# Patient Record
Sex: Female | Born: 1985 | Race: Black or African American | Hispanic: No | Marital: Single | Smoking: Former smoker
Health system: Southern US, Community
[De-identification: ages and names within clinical notes are randomized; demographics above are authoritative.]

## PROBLEM LIST (undated history)

## (undated) ENCOUNTER — Inpatient Hospital Stay (HOSPITAL_COMMUNITY): Payer: Self-pay

## (undated) DIAGNOSIS — I10 Essential (primary) hypertension: Secondary | ICD-10-CM

---

## 2010-04-02 ENCOUNTER — Ambulatory Visit: Payer: Self-pay | Admitting: Obstetrics & Gynecology

## 2010-04-02 ENCOUNTER — Inpatient Hospital Stay (HOSPITAL_COMMUNITY)
Admission: AD | Admit: 2010-04-02 | Discharge: 2010-04-09 | Payer: Self-pay | Source: Home / Self Care | Admitting: Obstetrics & Gynecology

## 2010-04-06 ENCOUNTER — Encounter: Payer: Self-pay | Admitting: Obstetrics & Gynecology

## 2010-04-09 ENCOUNTER — Encounter: Admission: RE | Admit: 2010-04-09 | Discharge: 2010-04-12 | Payer: Self-pay | Admitting: Obstetrics & Gynecology

## 2010-12-30 LAB — CBC
HCT: 35.5 % — ABNORMAL LOW (ref 36.0–46.0)
HCT: 39.5 % (ref 36.0–46.0)
Hemoglobin: 12 g/dL (ref 12.0–15.0)
Hemoglobin: 13.4 g/dL (ref 12.0–15.0)
MCH: 30.3 pg (ref 26.0–34.0)
MCHC: 34 g/dL (ref 30.0–36.0)
MCV: 89.9 fL (ref 78.0–100.0)
Platelets: 150 10*3/uL (ref 150–400)
RBC: 3.97 MIL/uL (ref 3.87–5.11)
RBC: 4.4 MIL/uL (ref 3.87–5.11)
RDW: 13 % (ref 11.5–15.5)
WBC: 10.1 10*3/uL (ref 4.0–10.5)
WBC: 15 10*3/uL — ABNORMAL HIGH (ref 4.0–10.5)

## 2010-12-30 LAB — RPR: RPR Ser Ql: NONREACTIVE

## 2010-12-30 LAB — COMPREHENSIVE METABOLIC PANEL
AST: 16 U/L (ref 0–37)
BUN: 5 mg/dL — ABNORMAL LOW (ref 6–23)
CO2: 25 mEq/L (ref 19–32)
Chloride: 105 mEq/L (ref 96–112)
Creatinine, Ser: 0.45 mg/dL (ref 0.4–1.2)
Glucose, Bld: 97 mg/dL (ref 70–99)
Sodium: 135 mEq/L (ref 135–145)
Total Bilirubin: 0.3 mg/dL (ref 0.3–1.2)
Total Protein: 5.8 g/dL — ABNORMAL LOW (ref 6.0–8.3)

## 2010-12-30 LAB — WET PREP, GENITAL: Yeast Wet Prep HPF POC: NONE SEEN

## 2010-12-30 LAB — GC/CHLAMYDIA PROBE AMP, GENITAL: Chlamydia, DNA Probe: NEGATIVE

## 2013-12-14 ENCOUNTER — Encounter (HOSPITAL_COMMUNITY): Payer: Self-pay | Admitting: Emergency Medicine

## 2013-12-14 ENCOUNTER — Emergency Department (HOSPITAL_COMMUNITY)
Admission: EM | Admit: 2013-12-14 | Discharge: 2013-12-14 | Disposition: A | Discharge status: Home / Self Care | Payer: 59 | Attending: Emergency Medicine | Admitting: Emergency Medicine

## 2013-12-14 DIAGNOSIS — Y939 Activity, unspecified: Secondary | ICD-10-CM | POA: Insufficient documentation

## 2013-12-14 DIAGNOSIS — Y929 Unspecified place or not applicable: Secondary | ICD-10-CM | POA: Insufficient documentation

## 2013-12-14 DIAGNOSIS — W010XXA Fall on same level from slipping, tripping and stumbling without subsequent striking against object, initial encounter: Secondary | ICD-10-CM | POA: Insufficient documentation

## 2013-12-14 DIAGNOSIS — S01501A Unspecified open wound of lip, initial encounter: Secondary | ICD-10-CM | POA: Insufficient documentation

## 2013-12-14 DIAGNOSIS — F17200 Nicotine dependence, unspecified, uncomplicated: Secondary | ICD-10-CM | POA: Insufficient documentation

## 2013-12-14 DIAGNOSIS — S01511A Laceration without foreign body of lip, initial encounter: Secondary | ICD-10-CM

## 2013-12-14 NOTE — ED Provider Notes (Signed)
CSN: [id number]     Arrival date & time 12/14/13  1705 History  This chart was scribed for non-physician practitioner Lowella Dell, PA-C working with Layla Maw Ward, DO by Danella Maiers, ED Scribe. This patient was seen in room TR09C/TR09C and the patient's care was started at 5:32 PM.   Chief Complaint  Patient presents with  . Lip Laceration   The history is provided by the patient. No language interpreter was used.   HPI Comments: Diamond Patel is a 28 y.o. female who presents to the Emergency Department complaining of a laceration to her right lateral lower lip onset tonight after tripping and falling on some steps. She was on the second to last stair when she fell. She denies dizziness, CP, or SOB prior to fall. She states her mouth struck the wooden railing on the bridge. She is not on any blood thinners. She denies LOC, vision changes, any pain.   Nursing noted reveals patient complaining of Right foot pain, though patient denies any foot pain currently.    History reviewed. No pertinent past medical history. History reviewed. No pertinent past surgical history. No family history on file. History  Substance Use Topics  . Smoking status: Current Every Day Smoker -- 0.10 packs/day    Types: Cigarettes  . Smokeless tobacco: Not on file  . Alcohol Use: Yes     Comment: occasionally   OB History   Grav Para Term Preterm Abortions TAB SAB Ect Mult Living                 Review of Systems  All other systems reviewed and are negative.      Allergies  Review of patient's allergies indicates no known allergies.  Home Medications  No current outpatient prescriptions on file. BP 155/95  Pulse 102  Temp(Src) 98.8 F (37.1 C) (Oral)  Resp 18  Wt 155 lb (70.308 kg)  SpO2 99%  LMP 12/11/2013 Physical Exam  Nursing note and vitals reviewed. Constitutional: She is oriented to person, place, and time. She appears well-developed and well-nourished. No distress.  HENT:  Head:  Normocephalic and atraumatic.  Mouth/Throat:     Small lip lac to right lower vermillion border. Approximately 1 cm in length and 1cm deep.   Eyes: Conjunctivae are normal.  Neck: No JVD present. No tracheal deviation present.  Cardiovascular: Normal rate and regular rhythm.  Exam reveals no gallop and no friction rub.   No murmur heard. Pulmonary/Chest: Effort normal. No respiratory distress. She has no wheezes. She has no rhonchi. She has no rales.  Musculoskeletal: Normal range of motion. She exhibits no edema.  right foot non-tender no bruising or ecchymosis. No swelling. No injury.   Neurological: She is alert and oriented to person, place, and time.  Skin: Skin is warm and dry. She is not diaphoretic.  Psychiatric: She has a normal mood and affect. Her behavior is normal.    ED Course  Procedures (including critical care time) Medications - No data to display  DIAGNOSTIC STUDIES: Oxygen Saturation is 99% on RA, normal by my interpretation.    COORDINATION OF CARE: 5:59 PM- Discussed treatment plan with pt which includes lac repair. Pt agrees to plan.  LACERATION REPAIR Performed by: Rudene Anda, PA-C  Consent: Verbal consent obtained. Risks and benefits: risks, benefits and alternatives were discussed Patient identity confirmed: provided demographic data Time out performed prior to procedure Prepped and Draped in normal sterile fashion Wound explored Laceration Location: right lateral lower  lip Laceration Length: 1cm No Foreign Bodies seen or palpated Anesthesia: local infiltration Local anesthetic: lidocaine 2% without epinephrine Anesthetic total: 2 ml Irrigation method: syringe Amount of cleaning: standard Skin closure: 5-0 Vicryl Number of sutures or staples: 4 Technique: simple interrupted Patient tolerance: Patient tolerated the procedure well with no immediate complications.     Labs Review Labs Reviewed - No data to display Imaging Review No  results found.   EKG Interpretation None      MDM   Final diagnoses:  Lip laceration   Patient afebrile. BP elevated. Patient asymptomatic, in NAD.  Patient advised to return to ED in 5 days for suture removal. Patient agrees with plan. Discussed wound care with patient. Patient confirms understanding. Discharged in good condition.    I personally performed the services described in this documentation, which was scribed in my presence. The recorded information has been reviewed and is accurate.   Rudene AndaJacob Gray Marilynn Ekstein, PA-C 12/17/13 1340

## 2013-12-14 NOTE — Discharge Instructions (Signed)
Return To ED in 5 days for suture removal. Return earlier if you develop any worsening pain, redness, or drainage from the area.    Facial Laceration  A facial laceration is a cut on the face. These injuries can be painful and cause bleeding. Lacerations usually heal quickly, but they need special care to reduce scarring. DIAGNOSIS  Your health care provider will take a medical history, ask for details about how the injury occurred, and examine the wound to determine how deep the cut is. TREATMENT  Some facial lacerations may not require closure. Others may not be able to be closed because of an increased risk of infection. The risk of infection and the chance for successful closure will depend on various factors, including the amount of time since the injury occurred. The wound may be cleaned to help prevent infection. If closure is appropriate, pain medicines may be given if needed. Your health care provider will use stitches (sutures), wound glue (adhesive), or skin adhesive strips to repair the laceration. These tools bring the skin edges together to allow for faster healing and a better cosmetic outcome. If needed, you may also be given a tetanus shot. HOME CARE INSTRUCTIONS  Only take over-the-counter or prescription medicines as directed by your health care provider.  Follow your health care provider's instructions for wound care. These instructions will vary depending on the technique used for closing the wound. For Sutures:  Keep the wound clean and dry.   If you were given a bandage (dressing), you should change it at least once a day. Also change the dressing if it becomes wet or dirty, or as directed by your health care provider.   Wash the wound with soap and water 2 times a day. Rinse the wound off with water to remove all soap. Pat the wound dry with a clean towel.   After cleaning, apply a thin layer of the antibiotic ointment recommended by your health care provider. This  will help prevent infection and keep the dressing from sticking.   You may shower as usual after the first 24 hours. Do not soak the wound in water until the sutures are removed.   Get your sutures removed as directed by your health care provider. With facial lacerations, sutures should usually be taken out after 4 5 days to avoid stitch marks.   Wait a few days after your sutures are removed before applying any makeup. For Skin Adhesive Strips:  Keep the wound clean and dry.   Do not get the skin adhesive strips wet. You may bathe carefully, using caution to keep the wound dry.   If the wound gets wet, pat it dry with a clean towel.   Skin adhesive strips will fall off on their own. You may trim the strips as the wound heals. Do not remove skin adhesive strips that are still stuck to the wound. They will fall off in time.  For Wound Adhesive:  You may briefly wet your wound in the shower or bath. Do not soak or scrub the wound. Do not swim. Avoid periods of heavy sweating until the skin adhesive has fallen off on its own. After showering or bathing, gently pat the wound dry with a clean towel.   Do not apply liquid medicine, cream medicine, ointment medicine, or makeup to your wound while the skin adhesive is in place. This may loosen the film before your wound is healed.   If a dressing is placed over the wound, be  careful not to apply tape directly over the skin adhesive. This may cause the adhesive to be pulled off before the wound is healed.   Avoid prolonged exposure to sunlight or tanning lamps while the skin adhesive is in place.  The skin adhesive will usually remain in place for 5 10 days, then naturally fall off the skin. Do not pick at the adhesive film.  After Healing: Once the wound has healed, cover the wound with sunscreen during the day for 1 full year. This can help minimize scarring. Exposure to ultraviolet light in the first year will darken the scar. It can  take 1 2 years for the scar to lose its redness and to heal completely.  SEEK IMMEDIATE MEDICAL CARE IF:  You have redness, pain, or swelling around the wound.   You see ayellowish-white fluid (pus) coming from the wound.   You have chills or a fever.  MAKE SURE YOU:  Understand these instructions.  Will watch your condition.  Will get help right away if you are not doing well or get worse. Document Released: 11/07/2004 Document Revised: 07/21/2013 Document Reviewed: 05/13/2013 [facility] Patient Information 2014 [place], [place].

## 2013-12-14 NOTE — ED Notes (Signed)
Pts BP elevated, asked pt to stop talking on the phone for a few minutes to recheck her BP and discuss discharge papers. Pt states that her BP is fine and she is fine to go, that she is not under 18. Explained to pt that her BP is elevated and I would prefer to recheck it and if its elevated notify the physician. Pt states that she is fine and wishes to sign out. Pt states her BP is high because she is talking on the phone. Pt signs out and is still on the phone.

## 2013-12-14 NOTE — ED Notes (Signed)
Pt sts she slipped on some steps and fell. C/o small lip laceration to right lip. Pt also c/o right foot pain. Denies LOC. Nad, skin warm and dry, resp e/u.

## 2013-12-14 NOTE — ED Notes (Signed)
Patient states that she fell and struck her jaw.  A small LAC is noted on her right lower lateral lip.  Some bruising is noted on her inner lip.  Denies LOC, headache.

## 2013-12-17 NOTE — ED Provider Notes (Signed)
Medical screening examination/treatment/procedure(s) were performed by non-physician practitioner and as supervising physician I was immediately available for consultation/collaboration.   EKG Interpretation None        Darrien Belter N Tiffanyann Deroo, DO 12/17/13 1503 

## 2014-12-09 ENCOUNTER — Emergency Department (HOSPITAL_COMMUNITY)
Admission: EM | Admit: 2014-12-09 | Discharge: 2014-12-09 | Disposition: A | Discharge status: Home / Self Care | Payer: 59 | Attending: Emergency Medicine | Admitting: Emergency Medicine

## 2014-12-09 ENCOUNTER — Encounter (HOSPITAL_COMMUNITY): Payer: Self-pay | Admitting: *Deleted

## 2014-12-09 DIAGNOSIS — R112 Nausea with vomiting, unspecified: Secondary | ICD-10-CM | POA: Insufficient documentation

## 2014-12-09 DIAGNOSIS — R197 Diarrhea, unspecified: Secondary | ICD-10-CM | POA: Insufficient documentation

## 2014-12-09 DIAGNOSIS — IMO0001 Reserved for inherently not codable concepts without codable children: Secondary | ICD-10-CM

## 2014-12-09 DIAGNOSIS — K029 Dental caries, unspecified: Secondary | ICD-10-CM | POA: Insufficient documentation

## 2014-12-09 DIAGNOSIS — R030 Elevated blood-pressure reading, without diagnosis of hypertension: Secondary | ICD-10-CM | POA: Insufficient documentation

## 2014-12-09 DIAGNOSIS — Z720 Tobacco use: Secondary | ICD-10-CM | POA: Insufficient documentation

## 2014-12-09 DIAGNOSIS — R1013 Epigastric pain: Secondary | ICD-10-CM

## 2014-12-09 DIAGNOSIS — R079 Chest pain, unspecified: Secondary | ICD-10-CM | POA: Insufficient documentation

## 2014-12-09 MED ORDER — HYDROCODONE-ACETAMINOPHEN 5-325 MG PO TABS
1.0000 | ORAL_TABLET | Freq: Once | ORAL | Status: AC
  Administered 2014-12-09: 1 via ORAL
  Filled 2014-12-09: qty 1

## 2014-12-09 MED ORDER — ONDANSETRON HCL 4 MG PO TABS: 4.0000 mg | ORAL_TABLET | Freq: Four times a day (QID) | ORAL | Status: DC

## 2014-12-09 MED ORDER — OMEPRAZOLE 20 MG PO CPDR: 20.0000 mg | DELAYED_RELEASE_CAPSULE | Freq: Every day | ORAL | Status: DC

## 2014-12-09 MED ORDER — HYDROCODONE-ACETAMINOPHEN 5-325 MG PO TABS: 1.0000 | ORAL_TABLET | Freq: Four times a day (QID) | ORAL | Status: DC | PRN

## 2014-12-09 MED ORDER — PENICILLIN V POTASSIUM 500 MG PO TABS: 500.0000 mg | ORAL_TABLET | Freq: Three times a day (TID) | ORAL | Status: DC

## 2014-12-09 NOTE — ED Provider Notes (Signed)
CSN: [id number]     Arrival date & time 12/09/14  0919 History  This chart was scribed for Diamond Piedra, PA-C working with [name], MD by Elveria Rising, ED Scribe. This patient was seen in room TR09C/TR09C and the patient's care was started at 9:40 AM.   Chief Complaint  Patient presents with  . Dental Pain   The history is provided by the patient. No language interpreter was used.   HPI Comments: Diamond Patel is a 29 y.o. female who presents to the Emergency Department complaining of throbbing right upper dental pain ongoing for two weeks. Patient denies injury to the tooth, cavities, or recent dental procedures performed. Patient denies difficulty opening mouth. Patient reports treatment with Aleve, ibuprofen, and Advil. Patient reports taking four tablets a day.   Patient reports upper abdominal/epigastric pain for three weeks now. Patient likens pain to a muscle spasm or cramp that feels constantly. Patient reports that she has taken three pregnancy tests, all resulting negatively. Patient reports onset of her abdominal pain prior to her dental pain and taking Aleve regularly for treatment.  Patient reports urinary frequency, vaginal discharge and chest pain associated with onset of her abdominal pain. Patient denies reflux sensation, heart burn, hematochezia, or melena. Patient reports an upcoming appointment with her PCP 3/04, one week from now.   History reviewed. No pertinent past medical history. History reviewed. No pertinent past surgical history. History reviewed. No pertinent family history. History  Substance Use Topics  . Smoking status: Current Every Day Smoker -- 0.10 packs/day    Types: Cigarettes  . Smokeless tobacco: Not on file  . Alcohol Use: Yes     Comment: occasionally   OB History    No data available     Review of Systems  Constitutional: Negative for fever and chills.  HENT: Positive for dental problem. Negative for sore throat and trouble  swallowing.   Cardiovascular: Positive for chest pain.  Gastrointestinal: Positive for nausea, vomiting, abdominal pain and diarrhea. Negative for constipation.  Genitourinary: Positive for frequency. Negative for dysuria, urgency and hematuria.  All other systems reviewed and are negative.  Allergies  Review of patient's allergies indicates no known allergies.  Home Medications   Prior to Admission medications   Medication Sig Start Date End Date Taking? Authorizing Provider  HYDROcodone-acetaminophen (NORCO/VICODIN) 5-325 MG per tablet Take 1 tablet by mouth every 6 (six) hours as needed. 12/09/14   Hannibal Skalla A Forcucci, PA-C  omeprazole (PRILOSEC) 20 MG capsule Take 1 capsule (20 mg total) by mouth daily. 12/09/14   Yetunde Leis A Forcucci, PA-C  ondansetron (ZOFRAN) 4 MG tablet Take 1 tablet (4 mg total) by mouth every 6 (six) hours. 12/09/14   Sharlene Mccluskey A Forcucci, PA-C  penicillin v potassium (VEETID) 500 MG tablet Take 1 tablet (500 mg total) by mouth 3 (three) times daily. 12/09/14   Braelon Sprung A Forcucci, PA-C   Triage Vitals: BP 179/112 mmHg  Pulse 94  Temp(Src) 97.5 F (36.4 C) (Oral)  Resp 16  Ht  (1.626 m)  Wt 162 lb (73.483 kg)  BMI 27.79 kg/m2  SpO2 100%  LMP 10/15/2014 Physical Exam  Constitutional: She is oriented to person, place, and time. She appears well-developed and well-nourished. No distress.  HENT:  Head: Normocephalic and atraumatic.  Mouth/Throat: Uvula is midline, oropharynx is clear and moist and mucous membranes are normal. No trismus in the jaw. Dental caries present. No dental abscesses or uvula swelling.    Eyes: Conjunctivae and EOM  are normal. Pupils are equal, round, and reactive to light. No scleral icterus.  Neck: Normal range of motion. Neck supple. No JVD present. No thyromegaly present.  Cardiovascular: Normal rate, regular rhythm, normal heart sounds and intact distal pulses.  Exam reveals no gallop and no friction rub.   No murmur  heard. Pulmonary/Chest: Effort normal and breath sounds normal. No respiratory distress. She has no wheezes. She has no rales. She exhibits no tenderness.  Abdominal: Soft. Normal appearance and bowel sounds are normal. She exhibits no distension and no mass. There is tenderness in the epigastric area. There is no rigidity, no rebound, no guarding, no CVA tenderness, no tenderness at McBurney's point and negative Murphy's sign.  Musculoskeletal: Normal range of motion.  Lymphadenopathy:    She has no cervical adenopathy.  Neurological: She is alert and oriented to person, place, and time.  Skin: Skin is warm and dry.  Psychiatric: She has a normal mood and affect. Her behavior is normal. Judgment and thought content normal.  Nursing note and vitals reviewed.   ED Course  Procedures (including critical care time)  COORDINATION OF CARE: 9:49 AM- Patient declines workup for her abdominal pain today and agrees to symptomatic treatment. Patient will be referred to dentist. Patient given return precautions. Discussed treatment plan with patient at bedside and patient agreed to plan.   Labs Review Labs Reviewed - No data to display  Imaging Review No results found.   EKG Interpretation None      MDM   Final diagnoses:  Dental caries  Epigastric pain  Elevated blood pressure   Patient is a 29 year old female who presents emergency room for multiple complaints including abdominal pain and dental pain. Physical exam reveals an alert and nontoxic-appearing female with hypertension on arrival at the ED. Suspect that this is likely secondary to pain. Patient does report epigastric pain with nausea and vomiting just been going on for approximately 3 weeks. This is sometimes associated with some chest pain. Patient does not have chest pain right now. Given history suspect that this is likely GERD versus PUD. No red flags of hematemesis or coffee-ground emesis. Abdomen soft and nontender with no  peritoneal signs. I have offered to perform laboratory testing to ensure that there are no other surrounding medical issues which the patient has declined at this time. She would like to try symptomatic treatment and follow-up with her PCP for this pain. She does have an appointment in a week. Will start on omeprazole and Zofran. Patient dental pain appears to be some decay around a filling. She is not seeing a dentist. We'll give dental resource list. Will start on amoxicillin to cover for infection and will give hydrocodone for pain management. Patient to return for trismus, drooling, facial swelling, or shortness of breath. For her abdominal pain I told her to return for intractable pain, intractable nausea and vomiting, coffee-ground emesis or hematemesis. She states understanding and agreement with the above workup and plan. Patient stable for discharge at this time.  I personally performed the services described in this documentation, which was scribed in my presence. The recorded information has been reviewed and is accurate.    Eben Burowourtney A Forcucci, PA-C 12/09/14 1001  Joya Gaskinsonald W Wickline, MD 12/10/14 [phone]

## 2014-12-09 NOTE — ED Notes (Signed)
Declined W/C at D/C and was escorted to lobby by RN. 

## 2014-12-09 NOTE — ED Notes (Signed)
Pt reports a 2 week HXof dental pain on Lt upper side.

## 2014-12-09 NOTE — Discharge Instructions (Signed)
Gastroesophageal Reflux Disease, Adult Gastroesophageal reflux disease (GERD) happens when acid from your stomach flows up into the esophagus. When acid comes in contact with the esophagus, the acid causes soreness (inflammation) in the esophagus. Over time, GERD may create small holes (ulcers) in the lining of the esophagus. CAUSES   Increased body weight. This puts pressure on the stomach, making acid rise from the stomach into the esophagus.  Smoking. This increases acid production in the stomach.  Drinking alcohol. This causes decreased pressure in the lower esophageal sphincter (valve or ring of muscle between the esophagus and stomach), allowing acid from the stomach into the esophagus.  Late evening meals and a full stomach. This increases pressure and acid production in the stomach.  A malformed lower esophageal sphincter. Sometimes, no cause is found. SYMPTOMS   Burning pain in the lower part of the mid-chest behind the breastbone and in the mid-stomach area. This may occur twice a week or more often.  Trouble swallowing.  Sore throat.  Dry cough.  Asthma-like symptoms including chest tightness, shortness of breath, or wheezing. DIAGNOSIS  Your caregiver may be able to diagnose GERD based on your symptoms. In some cases, X-rays and other tests may be done to check for complications or to check the condition of your stomach and esophagus. TREATMENT  Your caregiver may recommend over-the-counter or prescription medicines to help decrease acid production. Ask your caregiver before starting or adding any new medicines.  HOME CARE INSTRUCTIONS   Change the factors that you can control. Ask your caregiver for guidance concerning weight loss, quitting smoking, and alcohol consumption.  Avoid foods and drinks that make your symptoms worse, such as:  Caffeine or alcoholic drinks.  Chocolate.  Peppermint or mint flavorings.  Garlic and onions.  Spicy foods.  Citrus fruits,  such as oranges, lemons, or limes.  Tomato-based foods such as sauce, chili, salsa, and pizza.  Fried and fatty foods.  Avoid lying down for the 3 hours prior to your bedtime or prior to taking a nap.  Eat small, frequent meals instead of large meals.  Wear loose-fitting clothing. Do not wear anything tight around your waist that causes pressure on your stomach.  Raise the head of your bed 6 to 8 inches with wood blocks to help you sleep. Extra pillows will not help.  Only take over-the-counter or prescription medicines for pain, discomfort, or fever as directed by your caregiver.  Do not take aspirin, ibuprofen, or other nonsteroidal anti-inflammatory drugs (NSAIDs). SEEK IMMEDIATE MEDICAL CARE IF:   You have pain in your arms, neck, jaw, teeth, or back.  Your pain increases or changes in intensity or duration.  You develop nausea, vomiting, or sweating (diaphoresis).  You develop shortness of breath, or you faint.  Your vomit is green, yellow, black, or looks like coffee grounds or blood.  Your stool is red, bloody, or black. These symptoms could be signs of other problems, such as heart disease, gastric bleeding, or esophageal bleeding. MAKE SURE YOU:   Understand these instructions.  Will watch your condition.  Will get help right away if you are not doing well or get worse. Document Released: 07/10/2005 Document Revised: 12/23/2011 Document Reviewed: 04/19/2011 [facility] Patient Information 2015 [place], [place]. This information is not intended to replace advice given to you by your health care provider. Make sure you discuss any questions you have with your health care provider.  Dental Pain A tooth ache may be caused by cavities (tooth decay). Cavities expose the  nerve of the tooth to air and hot or cold temperatures. It may come from an infection or abscess (also called a boil or furuncle) around your tooth. It is also often caused by dental caries (tooth decay). This  causes the pain you are having. DIAGNOSIS  Your caregiver can diagnose this problem by exam. TREATMENT   If caused by an infection, it may be treated with medications which kill germs (antibiotics) and pain medications as prescribed by your caregiver. Take medications as directed.  Only take over-the-counter or prescription medicines for pain, discomfort, or fever as directed by your caregiver.  Whether the tooth ache today is caused by infection or dental disease, you should see your dentist as soon as possible for further care. SEEK MEDICAL CARE IF: The exam and treatment you received today has been provided on an emergency basis only. This is not a substitute for complete medical or dental care. If your problem worsens or new problems (symptoms) appear, and you are unable to meet with your dentist, call or return to this location. SEEK IMMEDIATE MEDICAL CARE IF:   You have a fever.  You develop redness and swelling of your face, jaw, or neck.  You are unable to open your mouth.  You have severe pain uncontrolled by pain medicine. MAKE SURE YOU:   Understand these instructions.  Will watch your condition.  Will get help right away if you are not doing well or get worse. Document Released: 09/30/2005 Document Revised: 12/23/2011 Document Reviewed: 05/18/2008 [facility] Patient Information 2015 [place], [place]. This information is not intended to replace advice given to you by your health care provider. Make sure you discuss any questions you have with your health care provider.  Hypertension Hypertension, commonly called high blood pressure, is when the force of blood pumping through your arteries is too strong. Your arteries are the blood vessels that carry blood from your heart throughout your body. A blood pressure reading consists of a higher number over a lower number, such as 110/72. The higher number (systolic) is the pressure inside your arteries when your heart pumps. The  lower number (diastolic) is the pressure inside your arteries when your heart relaxes. Ideally you want your blood pressure below 120/80. Hypertension forces your heart to work harder to pump blood. Your arteries may become narrow or stiff. Having hypertension puts you at risk for heart disease, stroke, and other problems.  RISK FACTORS Some risk factors for high blood pressure are controllable. Others are not.  Risk factors you cannot control include:   Race. You may be at higher risk if you are African American.  Age. Risk increases with age.  Gender. Men are at higher risk than women before age 43 years. After age 72, women are at higher risk than men. Risk factors you can control include:  Not getting enough exercise or physical activity.  Being overweight.  Getting too much fat, sugar, calories, or salt in your diet.  Drinking too much alcohol. SIGNS AND SYMPTOMS Hypertension does not usually cause signs or symptoms. Extremely high blood pressure (hypertensive crisis) may cause headache, anxiety, shortness of breath, and nosebleed. DIAGNOSIS  To check if you have hypertension, your health care provider will measure your blood pressure while you are seated, with your arm held at the level of your heart. It should be measured at least twice using the same arm. Certain conditions can cause a difference in blood pressure between your right and left arms. A blood pressure reading that  is higher than normal on one occasion does not mean that you need treatment. If one blood pressure reading is high, ask your health care provider about having it checked again. TREATMENT  Treating high blood pressure includes making lifestyle changes and possibly taking medicine. Living a healthy lifestyle can help lower high blood pressure. You may need to change some of your habits. Lifestyle changes may include:  Following the DASH diet. This diet is high in fruits, vegetables, and whole grains. It is low  in salt, red meat, and added sugars.  Getting at least 2 hours of brisk physical activity every week.  Losing weight if necessary.  Not smoking.  Limiting alcoholic beverages.  Learning ways to reduce stress. If lifestyle changes are not enough to get your blood pressure under control, your health care provider may prescribe medicine. You may need to take more than one. Work closely with your health care provider to understand the risks and benefits. HOME CARE INSTRUCTIONS  Have your blood pressure rechecked as directed by your health care provider.   Take medicines only as directed by your health care provider. Follow the directions carefully. Blood pressure medicines must be taken as prescribed. The medicine does not work as well when you skip doses. Skipping doses also puts you at risk for problems.   Do not smoke.   Monitor your blood pressure at home as directed by your health care provider. SEEK MEDICAL CARE IF:   You think you are having a reaction to medicines taken.  You have recurrent headaches or feel dizzy.  You have swelling in your ankles.  You have trouble with your vision. SEEK IMMEDIATE MEDICAL CARE IF:  You develop a severe headache or confusion.  You have unusual weakness, numbness, or feel faint.  You have severe chest or abdominal pain.  You vomit repeatedly.  You have trouble breathing. MAKE SURE YOU:   Understand these instructions.  Will watch your condition.  Will get help right away if you are not doing well or get worse. Document Released: 09/30/2005 Document Revised: 02/14/2014 Document Reviewed: 07/23/2013 [facility] Patient Information 2015 [place], [place]. This information is not intended to replace advice given to you by your health care provider. Make sure you discuss any questions you have with your health care provider.   Emergency Department Resource Guide 1) Find a Doctor and Pay Out of Pocket Although you won't have to  find out who is covered by your insurance plan, it is a good idea to ask around and get recommendations. You will then need to call the office and see if the doctor you have chosen will accept you as a new patient and what types of options they offer for patients who are self-pay. Some doctors offer discounts or will set up payment plans for their patients who do not have insurance, but you will need to ask so you aren't surprised when you get to your appointment.  2) Contact Your Local Health Department Not all health departments have doctors that can see patients for sick visits, but many do, so it is worth a call to see if yours does. If you don't know where your local health department is, you can check in your phone book. The CDC also has a tool to help you locate your state's health department, and many state websites also have listings of all of their local health departments.  3) Find a Walk-in Clinic If your illness is not likely to be very severe or  complicated, you may want to try a walk in clinic. These are popping up all over the country in pharmacies, drugstores, and shopping centers. They're usually staffed by nurse practitioners or physician assistants that have been trained to treat common illnesses and complaints. They're usually fairly quick and inexpensive. However, if you have serious medical issues or chronic medical problems, these are probably not your best option.  No Primary Care Doctor: - Call Health Connect at  [phone] - they can help you locate a primary care doctor that  accepts your insurance, provides certain services, etc. - Physician Referral Service- [phone]  Chronic Pain Problems: Organization         Address  Phone   Notes  [facility]  [phone] Patients need to be referred by their primary care doctor.   Medication Assistance: Organization         Address  Phone   Notes  [facility] Medication [facility] [address] [place] [place], [place] [address] [phone] --Must be a resident of [facility] -- Must have NO insurance coverage whatsoever (no Medicaid/ Medicare, etc.) -- The pt. MUST have a primary care doctor that directs their care regularly and follows them in the community   [facility]  [phone]   [employer]  [phone]    Agencies that provide inexpensive medical care: Organization         Address  Phone   Notes  [facility]  [phone]   [facility]    [phone]   [facility] [address] [place], [place] [address] [phone]   [facility] [address] [place] [place] [phone]   Planned Parenthood    [phone]   [facility]    [phone]   [facility]  [address], [place] Phone:  [phone], Fax:  [phone] Hours of Operation:  9 am - 6 pm, M-F.  Also accepts Medicaid/Medicare and self-pay.  [facility]  [address], [place] Phone: [phone], Fax: [phone]. Hours of Operation:  8:30 am - 5:30 pm, M-F.  Also accepts Medicaid and self-pay.  [facility] [place] [address], [place] Phone: [phone]   [facility] [address] Natasha Bence [place], [place] [phone], Ext. [phone] Mondays & Thursdays: 7-9 AM.  First 15 patients are seen on a first come, first serve basis.    Medicaid-accepting [facility] Providers:  Organization         Address  Phone   Notes  [facility] [address], [place] [phone] Also accepts self-pay patients.  [facility] [address] Laurell Josephs [place], [place]  [phone]   [facility] [address], [place] [phone]   [facility] Family Medicine [address] [place] [phone]   Renaye Rakers [address], [place]   [phone] Only accepts [place] Access [place] patients after they have their name applied to their card.   Self-Pay (no insurance) in [facility]:  Organization         Address  Phone   Notes  Sickle Cell Patients, [facility] [address] [place], [place] [phone]   [facility] [address]  [address] [place] [phone]   [facility] [place]  [address], [place] [phone]   [facility]/Dr. Osei-Bonsu  [address] [place] or [address], [place] [phone] Phone number for both [place] and [place] locations is the same.  [facility] [address] [place] [phone]   [facility] [address], [place] or [address] [phone]   [facility] [address], [place] [phone], phone; [phone], fax Sees patients 1st and 3rd Saturday of every month.  Must not qualify for public or private insurance (i.e. Medicaid, Medicare, [place] Health Choice, Veterans' Benefits)  Household income should be no more than 200% of the poverty level The clinic cannot treat you if you are pregnant or think you are pregnant  Sexually transmitted diseases are not treated at the clinic.    Dental Care: Organization         Address  Phone  Notes  [facility] [address] [place], [place] [phone] Accepts children up to age 80 who are enrolled in [place] or [place] Health Choice; pregnant women with a Medicaid card; and children who have applied for Medicaid or [place] Health Choice, but were declined, whose parents can pay a reduced fee at time of service.  [facility]  [address], [place] [phone] Accepts children up to age  59 who are enrolled in [place] or [place] Health Choice; pregnant women with a Medicaid card; and children who have applied for Medicaid or  Health Choice, but were declined, whose parents can pay a reduced fee at time of service.  [facility]  [address] [place] [address] [place], [place] [phone] Patients are seen by appointment only. Walk-ins are not accepted. [facility] will see patients 61 years of age and older. Monday - Tuesday (8am-5pm) Most Wednesdays (8:30-5pm) $30 per visit, cash only  [facility]  [address], [place] [phone] Patients are seen by appointment only. Walk-ins are not accepted. [facility] will see patients 31 years of age and older. One Wednesday Evening (Monthly: Volunteer Based).  $30 per visit, cash only  [employer]  [phone] for adults; Children under age 27, call Graduate Pediatric Dentistry at [phone]. Children aged 54-14, please call [phone] to request a pediatric application.  Dental services are provided in all areas of dental care including fillings, crowns and bridges, complete and partial dentures, implants, gum treatment, root canals, and extractions. Preventive care is also provided. Treatment is provided to both adults and children. Patients are selected via a lottery and there is often a waiting list.   [facility] [address] [place]  [phone] [email or web address]   [facility] [address] [place], [place] [phone], Ext. [phone] Second and Fourth Thursday of each month, opens at 6:30 AM; Clinic ends at 9 AM.  Patients are seen on a first-come first-served basis, and a limited number are seen during each clinic.   [facility]  [address] Ether Griffins [place], [place] [phone]   Eligibility Requirements You must have lived in [place], [place], or [place] counties for at least the last three  months.   You cannot be  eligible for state or federal sponsored [place], including [employer], [place], or [employer].   You generally cannot be eligible for healthcare insurance through your employer.    How to apply: Eligibility screenings are held every Tuesday and Wednesday afternoon from 1:00 pm until 4:00 pm. You do not need an appointment for the interview!  [facility] [address] [place], [place] [phone]   [facility]  [phone]   [facility]  [phone]   [facility]  [phone]    Behavioral Health Resources in the Community: Intensive Outpatient Programs Organization         Address  Phone  Notes  [facility] Services [address] [place], [place] [phone]   [facility] [address] [place], [place] [phone]   [facility] [address] [place], [place]  [phone]   [facility] [address]  [place], [place] [phone] or [phone]   Substance Abuse Resources Organization         Address  Phone  Notes  [facility]  [phone]   [facility]  [phone]   [facility]  [phone]   Floydene Flock  [phone]   Residential & Outpatient Substance Abuse Program  [phone]   Psychological Services Organization         Address  Phone  Notes  [facility] Behavioral Health  [phone]   [facility] Services  [phone]   [facility] Mental Health [address], [place] [phone] or [phone]    Mobile Crisis Teams Organization         Address  Phone  Notes  [facility], Mobile Crisis Care Unit  [phone]   [facility]  [address] [place], [place] [phone]   Doristine Locks [address] [place] [phone]    Self-Help/Support  Groups Organization         Address  Phone             Notes  [facility] [place] - variety of support groups  336- I7437963 Call for more information  Narcotics Anonymous (NA), [facility] [address], [employer] [place]  2 meetings at this location   Statistician         Address  Phone  Notes  [facility] [address],    [place]  [phone]   [facility]  [address], [place] [address], [place], [place] [phone]   [facility] [address] [place], [place] [phone] Admissions: 8am-3pm M-F  [facility] [address],    [place], [place] [phone]   [facility] [address] [place], [place], [place] [phone]   [facility] [address]  [place], [place] [phone]   [facility] - Intensive Outpatient [address] Laurell Josephs [address], [place], [place] [phone]   [facility] ([facility] [address] [place]  [place], [place] [phone] or [phone]   [facility] ([facility]) [address] [place], [place] [phone] Accepts Medicaid  [facility] [place] [address],  [place] [phone] Substance Abuse/Addiction Treatment   [facility] Organization         Address  Phone  Notes  [facility]  [phone]   Angie Fava, PhD [address],  [place]   [phone] or [phone]) [phone]   [facility]   [address] [place], [place] [phone]   [facility] [address], [place], [place] [phone] Insurance/Medicaid/sponsorship through [facility] [address]                                    [place], [place] [phone] Therapy/tele-psych/case  [facility] [address]   [place], [place] [phone]    Dr. Lolly Mustache  [phone]   [facility]  [employer] [facility] Dept. 1) [address] [place] 2) [address] [place] 3)  [address], [place] [phone]  [phone]   [facility] Child Abuse Hotline (442)022-8095 or [phone] (After Hours)

## 2014-12-15 ENCOUNTER — Other Ambulatory Visit (HOSPITAL_COMMUNITY): Payer: Self-pay | Admitting: Nurse Practitioner

## 2014-12-15 DIAGNOSIS — R102 Pelvic and perineal pain: Secondary | ICD-10-CM

## 2014-12-15 DIAGNOSIS — Z30431 Encounter for routine checking of intrauterine contraceptive device: Secondary | ICD-10-CM

## 2014-12-19 ENCOUNTER — Other Ambulatory Visit (HOSPITAL_COMMUNITY): Payer: Self-pay | Admitting: Nurse Practitioner

## 2014-12-19 DIAGNOSIS — Z30431 Encounter for routine checking of intrauterine contraceptive device: Secondary | ICD-10-CM

## 2014-12-19 DIAGNOSIS — R102 Pelvic and perineal pain: Secondary | ICD-10-CM

## 2014-12-20 ENCOUNTER — Ambulatory Visit (HOSPITAL_COMMUNITY): Admission: RE | Admit: 2014-12-20 | Payer: Self-pay | Source: Ambulatory Visit

## 2014-12-26 ENCOUNTER — Ambulatory Visit (HOSPITAL_COMMUNITY)
Admission: RE | Admit: 2014-12-26 | Discharge: 2014-12-26 | Disposition: A | Discharge status: Home / Self Care | Payer: Medicaid Other | Source: Ambulatory Visit | Attending: Nurse Practitioner | Admitting: Nurse Practitioner

## 2014-12-26 DIAGNOSIS — R102 Pelvic and perineal pain: Secondary | ICD-10-CM

## 2014-12-26 DIAGNOSIS — Z30431 Encounter for routine checking of intrauterine contraceptive device: Secondary | ICD-10-CM | POA: Diagnosis present

## 2015-08-18 ENCOUNTER — Ambulatory Visit (INDEPENDENT_AMBULATORY_CARE_PROVIDER_SITE_OTHER): Payer: Medicaid Other | Admitting: Obstetrics and Gynecology

## 2015-08-18 ENCOUNTER — Encounter: Payer: Self-pay | Admitting: Obstetrics and Gynecology

## 2015-08-18 VITALS — BP 130/76 | Ht 64.0 in | Wt 169.0 lb

## 2015-08-18 DIAGNOSIS — T8389XA Other specified complication of genitourinary prosthetic devices, implants and grafts, initial encounter: Secondary | ICD-10-CM | POA: Diagnosis not present

## 2015-08-18 DIAGNOSIS — Z30432 Encounter for removal of intrauterine contraceptive device: Secondary | ICD-10-CM

## 2015-08-18 DIAGNOSIS — T8332XA Displacement of intrauterine contraceptive device, initial encounter: Secondary | ICD-10-CM | POA: Insufficient documentation

## 2015-08-18 DIAGNOSIS — Z30431 Encounter for routine checking of intrauterine contraceptive device: Secondary | ICD-10-CM

## 2015-08-18 DIAGNOSIS — N761 Subacute and chronic vaginitis: Secondary | ICD-10-CM

## 2015-08-18 MED ORDER — NORETHINDRONE 0.35 MG PO TABS: 1.0000 | ORAL_TABLET | Freq: Every day | ORAL | Status: DC

## 2015-08-18 MED ORDER — METRONIDAZOLE 500 MG PO TABS: 500.0000 mg | ORAL_TABLET | Freq: Two times a day (BID) | ORAL | Status: DC

## 2015-08-18 NOTE — Progress Notes (Signed)
Patient ID: Diamond Patel, female   DOB: [birth year], 29 y.o.   MRN: [id number]     [facility]  Patient name: Diamond Patel MRN [id number]  Date of birth: [birth year]  CC & HPI:  Diamond Patel is a 29 y.o. female presenting today for evaluation and possible IUD removal Pt reports after her IUD removal she plans to use oral contraceptives for birth control. Pt reports she went to the health department yesterday for IUD removal, however, they were unsuccessful. Pt reports one female partner for the past four years. Pt denies any current daily meds. Pt reports recurrent cervicitis with last episode one month ago which was treated with a round of antibiotics.   ROS:  Pt having heavy BV recurrences over last 3 yrs since IUD insert, and pt desires removal, she knows that there may be no association with BV sx, and is resistant to conservative measures.  Pertinent History Reviewed:   Reviewed: Significant for IUD x 3 yr. Prior OCP use. Stable partner x 4+yr Medical        History reviewed. No pertinent past medical history.                            Surgical Hx:   History reviewed. No pertinent past surgical history. no pregnancy desire Medications: Reviewed & Updated - see associated section                       Current outpatient prescriptions:  .  metroNIDAZOLE (FLAGYL) 500 MG tablet, Take 1 tablet (500 mg total) by mouth 2 (two) times daily., Disp: 14 tablet, Rfl: 3 .  norethindrone (MICRONOR,CAMILA,ERRIN) 0.35 MG tablet, Take 1 tablet (0.35 mg total) by mouth daily., Disp: 1 Package, Rfl: 11   Social History: Reviewed -  reports that she has been smoking Cigarettes.  She has been smoking about 0.10 packs per day. She has never used smokeless tobacco.  Objective Findings:  Vitals: Blood pressure 130/76, height 5\' 4"  (1.626 m), weight 169 lb (76.658 kg), last menstrual period 08/01/2015.  Physical Examination: General appearance - alert, well appearing, and in no distress, oriented  to person, place, and time and normal appearing weight Mental status - alert, oriented to person, place, and time, normal mood, behavior, speech, dress, motor activity, and thought processes Eyes - pupils equal and reactive, extraocular eye movements intact Neck - supple, no significant adenopathy A Abdomen - soft, nontender, nondistended, no masses or organomegaly Pelvic - normal external genitalia, vulva, vagina, cervix, uterus and adnexa, VULVA: normal appearing vulva with no masses, tenderness or lesions, VAGINA: normal appearing vagina with normal color and discharge, no lesions, CERVIX: normal appearing cervix without discharge or lesions, iud string not visible , UTERUS: uterus is normal size, shape, consistency and nontender,  AProbing of Endometrial cavity identifies the long string of IUD, but IUD is impacted and strong traction cannot remove it U/s limited TV to be done. U/s by me shows the IUD shadow noted high in the endometrium, not lying in standard position. Not felt to be perforated. IMP: probable malposition of IUD Adnexa normal  Further traction shows minimal progress at first , but with slow steady traction over 45 seconds , progress gradually made, and a bent IUD intact removed, with prongs intact but atypical position.  Pt tolerated procedure surprisingly well.  Assessment & Plan:   A:  1. IUD with malpositioning, removed  P:  1. Rx Micronor 2. Rx Flagyl rechk prn   By signing my name below, I, Marica Otter, attest that this documentation has been prepared under the direction and in the presence of Christin Bach, MD. Electronically Signed: Marica Otter, ED Scribe. 08/18/2015. 11:21 AM.   I personally performed the services described in this documentation, which was SCRIBED in my presence. The recorded information has been reviewed and considered accurate. It has been edited as necessary during review. Tilda Burrow, MD   I personally performed the  services described in this documentation, which was SCRIBED in my presence. The recorded information has been reviewed and considered accurate. It has been edited as necessary during review. Tilda Burrow, MD

## 2015-08-18 NOTE — Progress Notes (Signed)
Patient ID: Diamond Patel, female   DOB: [birth year], 29 y.o.   MRN: [id number] Pt here today for IUD removal. Pt states that the health department was unable to remove her IUD yesterday. Pt states that she has noticed that she has been having a lot of vaginal infections. Since getting the IUD.

## 2015-11-27 ENCOUNTER — Encounter (HOSPITAL_COMMUNITY): Payer: Self-pay | Admitting: Emergency Medicine

## 2015-11-27 ENCOUNTER — Emergency Department (HOSPITAL_COMMUNITY)
Admission: EM | Admit: 2015-11-27 | Discharge: 2015-11-27 | Disposition: A | Discharge status: Home / Self Care | Payer: Medicaid Other | Attending: Emergency Medicine | Admitting: Emergency Medicine

## 2015-11-27 DIAGNOSIS — F17210 Nicotine dependence, cigarettes, uncomplicated: Secondary | ICD-10-CM | POA: Insufficient documentation

## 2015-11-27 DIAGNOSIS — I10 Essential (primary) hypertension: Secondary | ICD-10-CM | POA: Insufficient documentation

## 2015-11-27 DIAGNOSIS — R079 Chest pain, unspecified: Secondary | ICD-10-CM | POA: Diagnosis present

## 2015-11-27 LAB — CBC
HEMATOCRIT: 44.6 % (ref 36.0–46.0)
HEMOGLOBIN: 15 g/dL (ref 12.0–15.0)
MCH: 30.2 pg (ref 26.0–34.0)
MCHC: 33.6 g/dL (ref 30.0–36.0)
MCV: 89.9 fL (ref 78.0–100.0)
Platelets: 193 10*3/uL (ref 150–400)
RBC: 4.96 MIL/uL (ref 3.87–5.11)
RDW: 13.5 % (ref 11.5–15.5)
WBC: 8.7 10*3/uL (ref 4.0–10.5)

## 2015-11-27 LAB — BASIC METABOLIC PANEL
ANION GAP: 11 (ref 5–15)
BUN: 6 mg/dL (ref 6–20)
CO2: 26 mmol/L (ref 22–32)
Calcium: 9.4 mg/dL (ref 8.9–10.3)
Chloride: 104 mmol/L (ref 101–111)
Creatinine, Ser: 0.7 mg/dL (ref 0.44–1.00)
GFR calc Af Amer: >60 mL/min (ref >60)
Glucose, Bld: 101 mg/dL — ABNORMAL HIGH (ref 65–99)
POTASSIUM: 3.6 mmol/L (ref 3.5–5.1)
SODIUM: 141 mmol/L (ref 135–145)

## 2015-11-27 LAB — I-STAT TROPONIN, ED: Troponin i, poc: 0 ng/mL (ref 0.00–0.08)

## 2015-11-27 NOTE — ED Notes (Signed)
Pt states today while at work she had a sudden onset of chest pressure around 12pm and went to her PCP. And was told to come to ED and that also that her bp was elevated. Pt is warm and dry.

## 2015-11-27 NOTE — ED Notes (Signed)
Pt called x 2 with no answer  

## 2015-12-28 ENCOUNTER — Inpatient Hospital Stay (HOSPITAL_COMMUNITY)
Admission: AD | Admit: 2015-12-28 | Discharge: 2015-12-28 | Disposition: A | Discharge status: Home / Self Care | Payer: Medicaid Other | Source: Ambulatory Visit | Attending: Obstetrics and Gynecology | Admitting: Obstetrics and Gynecology

## 2015-12-28 ENCOUNTER — Encounter (HOSPITAL_COMMUNITY): Payer: Self-pay | Admitting: *Deleted

## 2015-12-28 DIAGNOSIS — R4689 Other symptoms and signs involving appearance and behavior: Secondary | ICD-10-CM

## 2015-12-28 DIAGNOSIS — Z3202 Encounter for pregnancy test, result negative: Secondary | ICD-10-CM | POA: Insufficient documentation

## 2015-12-28 DIAGNOSIS — I10 Essential (primary) hypertension: Secondary | ICD-10-CM | POA: Insufficient documentation

## 2015-12-28 DIAGNOSIS — F17210 Nicotine dependence, cigarettes, uncomplicated: Secondary | ICD-10-CM | POA: Insufficient documentation

## 2015-12-28 HISTORY — DX: Essential (primary) hypertension: I10

## 2015-12-28 LAB — URINALYSIS, ROUTINE W REFLEX MICROSCOPIC
BILIRUBIN URINE: NEGATIVE
Glucose, UA: NEGATIVE mg/dL
Ketones, ur: NEGATIVE mg/dL
Leukocytes, UA: NEGATIVE
NITRITE: NEGATIVE
PROTEIN: NEGATIVE mg/dL
SPECIFIC GRAVITY, URINE: 1.02 (ref 1.005–1.030)
pH: 6 (ref 5.0–8.0)

## 2015-12-28 LAB — WET PREP, GENITAL
CLUE CELLS WET PREP: NONE SEEN
TRICH WET PREP: NONE SEEN
Yeast Wet Prep HPF POC: NONE SEEN

## 2015-12-28 LAB — ABO/RH: ABO/RH(D): B POS

## 2015-12-28 LAB — URINE MICROSCOPIC-ADD ON

## 2015-12-28 LAB — HCG, QUANTITATIVE, PREGNANCY

## 2015-12-28 LAB — POCT PREGNANCY, URINE: PREG TEST UR: NEGATIVE

## 2015-12-28 NOTE — MAU Provider Note (Signed)
History     CSN: [id number]  Arrival date and time: 12/28/15 1331   None     No chief complaint on file.  HPI   Ms.Diamond Patel is a 30 y.o. female [phone] presenting to MAU with concerns. The states she is pregnant with an IUD in place. The patient initially presented to the [facility] and they sent her here. The patient states that she currently has an IUD in place and had a positive pregnancy test at home last week. The patient is concerned because she has not been able to feel her IUD strings. She states that she has never been able to feel them and every time she goes to have her IUD strings checked they have to do an [place] to check location.   She denies pain or bleeding at this time.  Her IUD was placed at the health department 4 years ago.   The patient has chronic hypertension and says she does not want to take BP medicine. She says it has been prescribed and she has plenty at home.   OB History    Gravida Para Term Preterm AB TAB SAB Ectopic Multiple Living   2 1 0 Past Medical History  Diagnosis Date  . Hypertension     History reviewed. No pertinent past surgical history.  Family History  Problem Relation Age of Onset  . Hypertension Father   . Hypertension Mother     Social History  Substance Use Topics  . Smoking status: Current Some Day Smoker -- 0.10 packs/day    Types: Cigarettes  . Smokeless tobacco: Never Used  . Alcohol Use: Yes     Comment: occasionally    Allergies: No Known Allergies  Prescriptions prior to admission  Medication Sig Dispense Refill Last Dose  . metroNIDAZOLE (FLAGYL) 500 MG tablet Take 1 tablet (500 mg total) by mouth 2 (two) times daily. 14 tablet 3   . norethindrone (MICRONOR,CAMILA,ERRIN) 0.35 MG tablet Take 1 tablet (0.35 mg total) by mouth daily. 1 Package 11    Results for orders placed or performed during the hospital encounter of 12/28/15 (from the past 48 hour(s))  Urinalysis, Routine w reflex microscopic  (not at [facility])     Status: Abnormal   Collection Time: 12/28/15  1:43 PM  Result Value Ref Range   Color, Urine YELLOW YELLOW   APPearance CLEAR CLEAR   Specific Gravity, Urine 1.020 1.005 - 1.030   pH 6.0 5.0 - 8.0   Glucose, UA NEGATIVE NEGATIVE mg/dL   Hgb urine dipstick MODERATE (A) NEGATIVE   Bilirubin Urine NEGATIVE NEGATIVE   Ketones, ur NEGATIVE NEGATIVE mg/dL   Protein, ur NEGATIVE NEGATIVE mg/dL   Nitrite NEGATIVE NEGATIVE   Leukocytes, UA NEGATIVE NEGATIVE  Urine microscopic-add on     Status: Abnormal   Collection Time: 12/28/15  1:43 PM  Result Value Ref Range   Squamous Epithelial / LPF 0-5 (A) NONE SEEN   WBC, UA 0-5 0 - 5 WBC/hpf   RBC / HPF 6-30 0 - 5 RBC/hpf   Bacteria, UA RARE (A) NONE SEEN  Pregnancy, urine POC     Status: None   Collection Time: 12/28/15  2:24 PM  Result Value Ref Range   Preg Test, Ur NEGATIVE NEGATIVE    Comment:        THE SENSITIVITY OF THIS METHODOLOGY IS >24 mIU/mL   Wet prep, genital     Status:  Abnormal   Collection Time: 12/28/15  3:10 PM  Result Value Ref Range   Yeast Wet Prep HPF POC NONE SEEN NONE SEEN   Trich, Wet Prep NONE SEEN NONE SEEN   Clue Cells Wet Prep HPF POC NONE SEEN NONE SEEN   WBC, Wet Prep HPF POC FEW (A) NONE SEEN    Comment: MODERATE BACTERIA SEEN   Sperm PRESENT   hCG, quantitative, pregnancy     Status: None   Collection Time: 12/28/15  3:36 PM  Result Value Ref Range   hCG, Beta Chain, Quant, S <1 <5 mIU/mL    Comment:          GEST. AGE      CONC.  (mIU/mL)   <=1 WEEK        5 - 50     2 WEEKS       50 - 500     3 WEEKS       100 - 10,000     4 WEEKS     1,000 - 30,000     5 WEEKS     3,500 - 115,000   6-8 WEEKS     12,000 - 270,000    12 WEEKS     15,000 - 220,000        FEMALE AND NON-PREGNANT FEMALE:     LESS THAN 5 mIU/mL REPEATED TO VERIFY   ABO/Rh     Status: None (Preliminary result)   Collection Time: 12/28/15  3:36 PM  Result Value Ref Range   ABO/RH(D) B POS     Review of  Systems  Constitutional: Negative for fever and chills.  Eyes: Negative for blurred vision.  Cardiovascular: Negative for chest pain, palpitations and orthopnea.  Gastrointestinal: Negative for nausea, vomiting and abdominal pain.  Genitourinary: Negative for dysuria and urgency.   Physical Exam   Blood pressure 186/11, pulse 85, temperature 98.1 F (36.7 C), resp. rate 18, height 5\' 4"  (1.626 m), weight 172 lb 6.4 oz (78.2 kg), last menstrual period 10/15/2015.  Physical Exam  Constitutional: She is oriented to person, place, and time. She appears well-developed and well-nourished. No distress.  Respiratory: Effort normal.  GI: Soft. She exhibits no distension. There is no tenderness. There is no rebound.  Genitourinary:  Speculum exam: Vagina - Small amount of creamy discharge, no odor Cervix - No contact bleeding, IUD strings not visualized  Bimanual exam: Cervix closed, IUD strings not palpated  Uterus non tender, normal size Adnexa non tender, no masses bilaterally GC/Chlam, wet prep done Chaperone present for exam.  Musculoskeletal: Normal range of motion.  Neurological: She is alert and oriented to person, place, and time.  Skin: Skin is warm. She is not diaphoretic.  Psychiatric: Her behavior is normal.    MAU Course  Procedures  None  MDM  Quant negative UPT negative   Upon review of the medical record, [facility] due to recurrent BV. The IUD was not put back in, and she was given micronor.  When presenting Lab results and discussing the patient's medical history, the patient gave permission for me to discuss with the significant other present.  When asked about the IUD removal the patient says she now recalls the IUD being removed and then states that Dr. Emelda FearFerguson put another IUD in. She states she was never given birth control pills and says he was supposed to put another IUD in.   I informed the  patient multiple  times that she does not have an IUD in place per Dr. Rayna Sexton note. She is encouraged to follow up with [facility].  Patient walked out prior to repeat BP by RN  Assessment and Plan   A:  1. Chronic hypertension   2. Non-compliant behavior   3. Encounter for pregnancy test with result negative     P:  Discharge home in stable condition DASH diet Discussed the importance of taking BP medication  Discussed the risk of CVD, and heart disease with uncontrolled hypertension With any stroke, chest pain symptoms patient to go to [facility] ED Follow up with [facility] always.    Duane Lope, NP 12/28/2015 5:41 PM

## 2015-12-28 NOTE — MAU Note (Signed)
Pt has a IUD for 3 years. Had a short period last month. Had positive HPT.Stated  She has never been able to feel strings. Has had U/S to confirm placement last year. Pt denies any bleeding no abd pain. C/O some breast tenderness.

## 2015-12-28 NOTE — Discharge Instructions (Signed)
How to Take Your Blood Pressure HOW DO I GET A BLOOD PRESSURE MACHINE?  You can buy an electronic home blood pressure machine at your local pharmacy. Insurance will sometimes cover the cost if you have a prescription.  Ask your doctor what type of machine is best for you. There are different machines for your arm and your wrist.  If you decide to buy a machine to check your blood pressure on your arm, first check the size of your arm so you can buy the right size cuff. To check the size of your arm:   Use a measuring tape that shows both inches and centimeters.   Wrap the measuring tape around the upper-middle part of your arm. You may need someone to help you measure.   Write down your arm measurement in both inches and centimeters.   To measure your blood pressure correctly, it is important to have the right size cuff.   If your arm is up to 13 inches (up to 34 centimeters), get an adult cuff size.  If your arm is 13 to 17 inches (35 to 44 centimeters), get a large adult cuff size.    If your arm is 17 to 20 inches (45 to 52 centimeters), get an adult thigh cuff.  WHAT DO THE NUMBERS MEAN?   There are two numbers that make up your blood pressure. For example: 120/80.  The first number (120 in our example) is called the "systolic pressure." It is a measure of the pressure in your blood vessels when your heart is pumping blood.  The second number (80 in our example) is called the "diastolic pressure." It is a measure of the pressure in your blood vessels when your heart is resting between beats.  Your doctor will tell you what your blood pressure should be. WHAT SHOULD I DO BEFORE I CHECK MY BLOOD PRESSURE?   Try to rest or relax for at least 30 minutes before you check your blood pressure.  Do not smoke.  Do not have any drinks with caffeine, such as:  Soda.  Coffee.  Tea.  Check your blood pressure in a quiet room.  Sit down and stretch out your arm on a table.  Keep your arm at about the level of your heart. Let your arm relax.  Make sure that your legs are not crossed. HOW DO I CHECK MY BLOOD PRESSURE?  Follow the directions that came with your machine.  Make sure you remove any tight-fitting clothing from your arm or wrist. Wrap the cuff around your upper arm or wrist. You should be able to fit a finger between the cuff and your arm. If you cannot fit a finger between the cuff and your arm, it is too tight and should be removed and rewrapped.  Some units require you to manually pump up the arm cuff.  Automatic units inflate the cuff when you press a button.  Cuff deflation is automatic in both models.  After the cuff is inflated, the unit measures your blood pressure and pulse. The readings are shown on a monitor. Hold still and breathe normally while the cuff is inflated.  Getting a reading takes less than a minute.  Some models store readings in a memory. Some provide a printout of readings. If your machine does not store your readings, keep a written record.  Take readings with you to your next visit with your doctor.   This information is not intended to replace advice given to  you by your health care provider. Make sure you discuss any questions you have with your health care provider.   Document Released: 09/12/2008 Document Revised: 10/21/2014 Document Reviewed: 11/25/2013 Elsevier Interactive Patient Education 2016 Elsevier Inc.  DASH Eating Plan DASH stands for "Dietary Approaches to Stop Hypertension." The DASH eating plan is a healthy eating plan that has been shown to reduce high blood pressure (hypertension). Additional health benefits may include reducing the risk of type 2 diabetes mellitus, heart disease, and stroke. The DASH eating plan may also help with weight loss. WHAT DO I NEED TO KNOW ABOUT THE DASH EATING PLAN? For the DASH eating plan, you will follow these general guidelines:  Choose foods with a percent daily  value for sodium of less than 5% (as listed on the food label).  Use salt-free seasonings or herbs instead of table salt or sea salt.  Check with your health care provider or pharmacist before using salt substitutes.  Eat lower-sodium products, often labeled as "lower sodium" or "no salt added."  Eat fresh foods.  Eat more vegetables, fruits, and low-fat dairy products.  Choose whole grains. Look for the word "whole" as the first word in the ingredient list.  Choose fish and skinless chicken or [place] more often than red meat. Limit fish, poultry, and meat to 6 oz (170 g) each day.  Limit sweets, desserts, sugars, and sugary drinks.  Choose heart-healthy fats.  Limit cheese to 1 oz (28 g) per day.  Eat more home-cooked food and less restaurant, buffet, and fast food.  Limit fried foods.  Cook foods using methods other than frying.  Limit canned vegetables. If you do use them, rinse them well to decrease the sodium.  When eating at a restaurant, ask that your food be prepared with less salt, or no salt if possible. WHAT FOODS CAN I EAT? Seek help from a dietitian for individual calorie needs. Grains Whole grain or whole wheat bread. Brown rice. Whole grain or whole wheat pasta. Quinoa, bulgur, and whole grain cereals. Low-sodium cereals. Corn or whole wheat flour tortillas. Whole grain cornbread. Whole grain crackers. Low-sodium crackers. Vegetables Fresh or frozen vegetables (raw, steamed, roasted, or grilled). Low-sodium or reduced-sodium tomato and vegetable juices. Low-sodium or reduced-sodium tomato sauce and paste. Low-sodium or reduced-sodium canned vegetables.  Fruits All fresh, canned (in natural juice), or frozen fruits. Meat and Other Protein Products Ground beef (85% or leaner), grass-fed beef, or beef trimmed of fat. Skinless chicken or [place]. Ground chicken or [place]. Pork trimmed of fat. All fish and seafood. Eggs. Dried beans, peas, or lentils. Unsalted nuts  and seeds. Unsalted canned beans. Dairy Low-fat dairy products, such as skim or 1% milk, 2% or reduced-fat cheeses, low-fat ricotta or cottage cheese, or plain low-fat yogurt. Low-sodium or reduced-sodium cheeses. Fats and Oils Tub margarines without trans fats. Light or reduced-fat mayonnaise and salad dressings (reduced sodium). Avocado. Safflower, olive, or canola oils. Natural peanut or almond butter. Other Unsalted popcorn and pretzels. The items listed above may not be a complete list of recommended foods or beverages. Contact your dietitian for more options. WHAT FOODS ARE NOT RECOMMENDED? Grains White bread. White pasta. White rice. Refined cornbread. Bagels and croissants. Crackers that contain trans fat. Vegetables Creamed or fried vegetables. Vegetables in a cheese sauce. Regular canned vegetables. Regular canned tomato sauce and paste. Regular tomato and vegetable juices. Fruits Dried fruits. Canned fruit in light or heavy syrup. Fruit juice. Meat and Other Protein Products Fatty cuts of meat.  Ribs, chicken wings, bacon, sausage, bologna, salami, chitterlings, fatback, hot dogs, bratwurst, and packaged luncheon meats. Salted nuts and seeds. Canned beans with salt. Dairy Whole or 2% milk, cream, half-and-half, and cream cheese. Whole-fat or sweetened yogurt. Full-fat cheeses or blue cheese. Nondairy creamers and whipped toppings. Processed cheese, cheese spreads, or cheese curds. Condiments Onion and garlic salt, seasoned salt, table salt, and sea salt. Canned and packaged gravies. Worcestershire sauce. Tartar sauce. Barbecue sauce. Teriyaki sauce. Soy sauce, including reduced sodium. Steak sauce. Fish sauce. Oyster sauce. Cocktail sauce. Horseradish. Ketchup and mustard. Meat flavorings and tenderizers. Bouillon cubes. Hot sauce. Tabasco sauce. Marinades. Taco seasonings. Relishes. Fats and Oils Butter, stick margarine, lard, shortening, ghee, and bacon fat. Coconut, palm kernel, or  palm oils. Regular salad dressings. Other Pickles and olives. Salted popcorn and pretzels. The items listed above may not be a complete list of foods and beverages to avoid. Contact your dietitian for more information. WHERE CAN I FIND MORE INFORMATION? National Heart, Lung, and Blood Institute: CablePromo.itwww.nhlbi.nih.gov/health/health-topics/topics/dash/   This information is not intended to replace advice given to you by your health care provider. Make sure you discuss any questions you have with your health care provider.   Document Released: 09/19/2011 Document Revised: 10/21/2014 Document Reviewed: 08/04/2013 Elsevier Interactive Patient Education [employer]2016 Elsevier Inc.

## 2015-12-29 LAB — GC/CHLAMYDIA PROBE AMP (~~LOC~~) NOT AT ARMC
CHLAMYDIA, DNA PROBE: NEGATIVE
Neisseria Gonorrhea: NEGATIVE

## 2016-02-19 ENCOUNTER — Inpatient Hospital Stay (HOSPITAL_COMMUNITY)
Admission: AD | Admit: 2016-02-19 | Discharge: 2016-02-19 | Disposition: A | Discharge status: Home / Self Care | Payer: BLUE CROSS/BLUE SHIELD | Source: Ambulatory Visit | Attending: Family Medicine | Admitting: Family Medicine

## 2016-02-19 ENCOUNTER — Inpatient Hospital Stay (HOSPITAL_COMMUNITY): Payer: Self-pay

## 2016-02-19 ENCOUNTER — Encounter (HOSPITAL_COMMUNITY): Payer: Self-pay | Admitting: *Deleted

## 2016-02-19 DIAGNOSIS — N3000 Acute cystitis without hematuria: Secondary | ICD-10-CM | POA: Insufficient documentation

## 2016-02-19 DIAGNOSIS — O209 Hemorrhage in early pregnancy, unspecified: Secondary | ICD-10-CM

## 2016-02-19 DIAGNOSIS — O200 Threatened abortion: Secondary | ICD-10-CM | POA: Diagnosis not present

## 2016-02-19 DIAGNOSIS — Z3A09 9 weeks gestation of pregnancy: Secondary | ICD-10-CM | POA: Insufficient documentation

## 2016-02-19 DIAGNOSIS — O161 Unspecified maternal hypertension, first trimester: Secondary | ICD-10-CM | POA: Insufficient documentation

## 2016-02-19 DIAGNOSIS — F17210 Nicotine dependence, cigarettes, uncomplicated: Secondary | ICD-10-CM | POA: Insufficient documentation

## 2016-02-19 DIAGNOSIS — O99331 Smoking (tobacco) complicating pregnancy, first trimester: Secondary | ICD-10-CM | POA: Insufficient documentation

## 2016-02-19 DIAGNOSIS — O2311 Infections of bladder in pregnancy, first trimester: Secondary | ICD-10-CM | POA: Insufficient documentation

## 2016-02-19 LAB — WET PREP, GENITAL
CLUE CELLS WET PREP: NONE SEEN
SPERM: NONE SEEN
Trich, Wet Prep: NONE SEEN
Yeast Wet Prep HPF POC: NONE SEEN

## 2016-02-19 LAB — CBC
HEMATOCRIT: 41.8 % (ref 36.0–46.0)
Hemoglobin: 14.8 g/dL (ref 12.0–15.0)
MCH: 30.6 pg (ref 26.0–34.0)
MCHC: 35.4 g/dL (ref 30.0–36.0)
MCV: 86.4 fL (ref 78.0–100.0)
Platelets: 201 10*3/uL (ref 150–400)
RBC: 4.84 MIL/uL (ref 3.87–5.11)
RDW: 13.9 % (ref 11.5–15.5)
WBC: 9 10*3/uL (ref 4.0–10.5)

## 2016-02-19 LAB — URINALYSIS, ROUTINE W REFLEX MICROSCOPIC
BILIRUBIN URINE: NEGATIVE
Glucose, UA: NEGATIVE mg/dL
KETONES UR: NEGATIVE mg/dL
Leukocytes, UA: NEGATIVE
NITRITE: POSITIVE — AB
Protein, ur: NEGATIVE mg/dL
SPECIFIC GRAVITY, URINE: 1.01 (ref 1.005–1.030)
pH: 7 (ref 5.0–8.0)

## 2016-02-19 LAB — HCG, QUANTITATIVE, PREGNANCY: hCG, Beta Chain, Quant, S: 6552 m[IU]/mL — ABNORMAL HIGH (ref <5)

## 2016-02-19 LAB — POCT PREGNANCY, URINE: PREG TEST UR: POSITIVE — AB

## 2016-02-19 LAB — URINE MICROSCOPIC-ADD ON

## 2016-02-19 MED ORDER — CEPHALEXIN 500 MG PO CAPS: 500.0000 mg | ORAL_CAPSULE | Freq: Four times a day (QID) | ORAL | Status: DC

## 2016-02-19 NOTE — Discharge Instructions (Signed)
Threatened Miscarriage A threatened miscarriage occurs when you have vaginal bleeding during your first 20 weeks of pregnancy but the pregnancy has not ended. If you have vaginal bleeding during this time, your health care provider will do tests to make sure you are still pregnant. If the tests show you are still pregnant and the developing baby (fetus) inside your womb (uterus) is still growing, your condition is considered a threatened miscarriage. A threatened miscarriage does not mean your pregnancy will end, but it does increase the risk of losing your pregnancy (complete miscarriage). CAUSES  The cause of a threatened miscarriage is usually not known. If you go on to have a complete miscarriage, the most common cause is an abnormal number of chromosomes in the developing baby. Chromosomes are the structures inside cells that hold all your genetic material. Some causes of vaginal bleeding that do not result in miscarriage include:  Having sex.  Having an infection.  Normal hormone changes of pregnancy.  Bleeding that occurs when an egg implants in your uterus. RISK FACTORS Risk factors for bleeding in early pregnancy include:  Obesity.  Smoking.  Drinking excessive amounts of alcohol or caffeine.  Recreational drug use. SIGNS AND SYMPTOMS  Light vaginal bleeding.  Mild abdominal pain or cramps. DIAGNOSIS  If you have bleeding with or without abdominal pain before 20 weeks of pregnancy, your health care provider will do tests to check whether you are still pregnant. One important test involves using sound waves and a computer (ultrasound) to create images of the inside of your uterus. Other tests include an internal exam of your vagina and uterus (pelvic exam) and measurement of your baby's heart rate.  You may be diagnosed with a threatened miscarriage if:  Ultrasound testing shows you are still pregnant.  Your baby's heart rate is strong.  A pelvic exam shows that the  opening between your uterus and your vagina (cervix) is closed.  Your heart rate and blood pressure are stable.  Blood tests confirm you are still pregnant. TREATMENT  No treatments have been shown to prevent a threatened miscarriage from going on to a complete miscarriage. However, the right home care is important.  HOME CARE INSTRUCTIONS   Make sure you keep all your appointments for prenatal care. This is very important.  Get plenty of rest.  Do not have sex or use tampons if you have vaginal bleeding.  Do not douche.  Do not smoke or use recreational drugs.  Do not drink alcohol.  Avoid caffeine. SEEK MEDICAL CARE IF:  You have light vaginal bleeding or spotting while pregnant.  You have abdominal pain or cramping.  You have a fever. SEEK IMMEDIATE MEDICAL CARE IF:  You have heavy vaginal bleeding.  You have blood clots coming from your vagina.  You have severe low back pain or abdominal cramps.  You have fever, chills, and severe abdominal pain. MAKE SURE YOU:  Understand these instructions.  Will watch your condition.  Will get help right away if you are not doing well or get worse.   This information is not intended to replace advice given to you by your health care provider. Make sure you discuss any questions you have with your health care provider.   Document Released: 09/30/2005 Document Revised: 10/05/2013 Document Reviewed: 07/27/2013 Elsevier Interactive Patient Education 2016 Elsevier Inc.   Pregnancy and Urinary Tract Infection A urinary tract infection (UTI) is a bacterial infection of the urinary tract. Infection of the urinary tract can include the  ureters, kidneys (pyelonephritis), bladder (cystitis), and urethra (urethritis). All pregnant women should be screened for bacteria in the urinary tract. Identifying and treating a UTI will decrease the risk of preterm labor and developing more serious infections in both the mother and  baby. CAUSES Bacteria germs cause almost all UTIs.  RISK FACTORS Many factors can increase your chances of getting a UTI during pregnancy. These include:  Having a short urethra.  Poor toilet and hygiene habits.  Sexual intercourse.  Blockage of urine along the urinary tract.  Problems with the pelvic muscles or nerves.  Diabetes.  Obesity.  Bladder problems after having several children.  Previous history of UTI. SIGNS AND SYMPTOMS   Pain, burning, or a stinging feeling when urinating.  Suddenly feeling the need to urinate right away (urgency).  Loss of bladder control (urinary incontinence).  Frequent urination, more than is common with pregnancy.  Lower abdominal or back discomfort.  Cloudy urine.  Blood in the urine (hematuria).  Fever. When the kidneys are infected, the symptoms may be:  Back pain.  Flank pain on the right side more so than the left.  Fever.  Chills.  Nausea.  Vomiting. DIAGNOSIS  A urinary tract infection is usually diagnosed through urine tests. Additional tests and procedures are sometimes done. These may include:  Ultrasound exam of the kidneys, ureters, bladder, and urethra.  Looking in the bladder with a lighted tube (cystoscopy). TREATMENT Typically, UTIs can be treated with antibiotic medicines.  HOME CARE INSTRUCTIONS   Only take over-the-counter or prescription medicines as directed by your health care provider. If you were prescribed antibiotics, take them as directed. Finish them even if you start to feel better.  Drink enough fluids to keep your urine clear or pale yellow.  Do not have sexual intercourse until the infection is gone and your health care provider says it is okay.  Make sure you are tested for UTIs throughout your pregnancy. These infections often come back. Preventing a UTI in the Future  Practice good toilet habits. Always wipe from front to back. Use the tissue only once.  Do not hold your  urine. Empty your bladder as soon as possible when the urge comes.  Do not douche or use deodorant sprays.  Wash with soap and warm water around the genital area and the anus.  Empty your bladder before and after sexual intercourse.  Wear underwear with a cotton crotch.  Avoid caffeine and carbonated drinks. They can irritate the bladder.  Drink cranberry juice or take cranberry pills. This may decrease the risk of getting a UTI.  Do not drink alcohol.  Keep all your appointments and tests as scheduled. SEEK MEDICAL CARE IF:   Your symptoms get worse.  You are still having fevers 2 or more days after treatment begins.  You have a rash.  You feel that you are having problems with medicines prescribed.  You have abnormal vaginal discharge. SEEK IMMEDIATE MEDICAL CARE IF:   You have back or flank pain.  You have chills.  You have blood in your urine.  You have nausea and vomiting.  You have contractions of your uterus.  You have a gush of fluid from the vagina. MAKE SURE YOU:  Understand these instructions.   Will watch your condition.   Will get help right away if you are not doing well or get worse.    This information is not intended to replace advice given to you by your health care provider. Make sure  you discuss any questions you have with your health care provider.   Document Released: 01/25/2011 Document Revised: 07/21/2013 Document Reviewed: 04/29/2013 Elsevier Interactive Patient Education [employer]

## 2016-02-19 NOTE — MAU Note (Addendum)
Pt presents to MAU with complaints of spotting that started yesterday. PT reports irregular periods and doesn't know how far she is. Denies any pain

## 2016-02-19 NOTE — MAU Provider Note (Signed)
History     CSN: [id number]  Arrival date and time: 02/19/16 1213    First Provider Initiated Contact with Patient 02/19/16 1244       Chief Complaint  Patient presents with  . Vaginal Bleeding   HPI  Diamond Patel is a 30 y.o. [id number] at 413w5d by unsure LMP who presents with vaginal bleeding. Reports red spotting that started yesterday. Today the spotting is brown. Only on toilet paper.  Denies n/v/d, constipation, dysuria, or abdominal pain. Does reports increased urinary frequency & suprapubic pressure.  Last intercourse on Thursday.   OB History    Gravida Para Term Preterm AB TAB SAB Ectopic Multiple Living   3 1 0 1 1     1       Past Medical History  Diagnosis Date  . Hypertension     History reviewed. No pertinent past surgical history.  Family History  Problem Relation Age of Onset  . Hypertension Father   . Hypertension Mother     Social History  Substance Use Topics  . Smoking status: Current Some Day Smoker -- 0.10 packs/day    Types: Cigarettes  . Smokeless tobacco: Never Used  . Alcohol Use: Yes     Comment: occasionally    Allergies: No Known Allergies  Prescriptions prior to admission  Medication Sig Dispense Refill Last Dose  . metroNIDAZOLE (FLAGYL) 500 MG tablet Take 1 tablet (500 mg total) by mouth 2 (two) times daily. 14 tablet 3   . norethindrone (MICRONOR,CAMILA,ERRIN) 0.35 MG tablet Take 1 tablet (0.35 mg total) by mouth daily. 1 Package 11     Review of Systems  Constitutional: Negative.   Gastrointestinal: Negative.   Genitourinary: Positive for urgency and frequency. Negative for dysuria, hematuria and flank pain.       + vaginal bleeding   Physical Exam   Blood pressure 156/83, pulse 89, temperature 98.3 F (36.8 C), resp. rate 18, last menstrual period 12/13/2015.  Physical Exam  Nursing note and vitals reviewed. Constitutional: She is oriented to person, place, and time. She appears well-developed and well-nourished. No  distress.  HENT:  Head: Normocephalic and atraumatic.  Eyes: Conjunctivae are normal. Right eye exhibits no discharge. Left eye exhibits no discharge. No scleral icterus.  Neck: Normal range of motion.  Cardiovascular: Normal rate, regular rhythm and normal heart sounds.   No murmur heard. Respiratory: Effort normal and breath sounds normal. No respiratory distress. She has no wheezes.  GI: Soft. Bowel sounds are normal. She exhibits no distension. There is no tenderness. There is no rebound.  Genitourinary: Uterus normal. Cervix exhibits no motion tenderness and no friability. Right adnexum displays no mass, no tenderness and no fullness. Left adnexum displays no mass, no tenderness and no fullness. There is bleeding (small amount of dark red blood & mucous) in the vagina.  Cervix closed  Neurological: She is alert and oriented to person, place, and time.  Skin: Skin is warm and dry. She is not diaphoretic.  Psychiatric: She has a normal mood and affect. Her behavior is normal. Judgment and thought content normal.    MAU Course  Procedures Results for orders placed or performed during the hospital encounter of 02/19/16 (from the past 24 hour(s))  Urinalysis, Routine w reflex microscopic ([facility])     Status: Abnormal   Collection Time: 02/19/16 12:20 PM  Result Value Ref Range   Color, Urine YELLOW YELLOW   APPearance CLOUDY (A) CLEAR   Specific Gravity, Urine 1.010 1.005 -  1.030   pH 7.0 5.0 - 8.0   Glucose, UA NEGATIVE NEGATIVE mg/dL   Hgb urine dipstick LARGE (A) NEGATIVE   Bilirubin Urine NEGATIVE NEGATIVE   Ketones, ur NEGATIVE NEGATIVE mg/dL   Protein, ur NEGATIVE NEGATIVE mg/dL   Nitrite POSITIVE (A) NEGATIVE   Leukocytes, UA NEGATIVE NEGATIVE  Urine microscopic-add on     Status: Abnormal   Collection Time: 02/19/16 12:20 PM  Result Value Ref Range   Squamous Epithelial / LPF 0-5 (A) NONE SEEN   WBC, UA 0-5 0 - 5 WBC/hpf   RBC / HPF 0-5 0 - 5 RBC/hpf   Bacteria, UA  MANY (A) NONE SEEN  Pregnancy, urine POC     Status: Abnormal   Collection Time: 02/19/16 12:31 PM  Result Value Ref Range   Preg Test, Ur POSITIVE (A) NEGATIVE  hCG, quantitative, pregnancy     Status: Abnormal   Collection Time: 02/19/16 12:49 PM  Result Value Ref Range   hCG, Beta Chain, Quant, S 6552 (H) <5 mIU/mL  CBC     Status: None   Collection Time: 02/19/16 12:54 PM  Result Value Ref Range   WBC 9.0 4.0 - 10.5 K/uL   RBC 4.84 3.87 - 5.11 MIL/uL   Hemoglobin 14.8 12.0 - 15.0 g/dL   HCT 53.6 64.4 - 03.4 %   MCV 86.4 78.0 - 100.0 fL   MCH 30.6 26.0 - 34.0 pg   MCHC 35.4 30.0 - 36.0 g/dL   RDW 74.2 59.5 - 63.8 %   Platelets 201 150 - 400 K/uL  Wet prep, genital     Status: Abnormal   Collection Time: 02/19/16  1:00 PM  Result Value Ref Range   Yeast Wet Prep HPF POC NONE SEEN NONE SEEN   Trich, Wet Prep NONE SEEN NONE SEEN   Clue Cells Wet Prep HPF POC NONE SEEN NONE SEEN   WBC, Wet Prep HPF POC MODERATE (A) NONE SEEN   Sperm NONE SEEN    US Ob Comp Less 14 Wks  02/19/2016  CLINICAL DATA:  Vaginal bleeding. Gestational age by LMP is 9 weeks and 5 days. EXAM: OBSTETRIC <14 WK [place] AND TRANSVAGINAL OB US TECHNIQUE: Both transabdominal and transvaginal ultrasound examinations were performed for complete evaluation of the gestation as well as the maternal uterus, adnexal regions, and pelvic cul-de-sac. Transvaginal technique was performed to assess early pregnancy. COMPARISON:  Pelvic ultrasound 12/26/2014 FINDINGS: Intrauterine gestational sac: Present Yolk sac:  Present Embryo:  Present Cardiac Activity: None CRL:  4.9  mm   6 w   1 d                  [place] EDC: 10/13/2016 Subchorionic hemorrhage:  None visualized. Maternal uterus/adnexae: Gestational sac is slightly enlarged and irregular in shape. The right ovary measures 3.1 x 2.7 x 1.3 cm with a normal appearance. Left ovary measures 3.3 x 3.4 x 2.6 cm. There is an anechoic structure within the left ovary that is suggestive for a  corpus luteum cyst. No free fluid. IMPRESSION: Intrauterine pregnancy with a gestational age discrepancy between the LMP and ultrasound. Gestational age by ultrasound is 6 weeks and 1 day and gestational age by LMP is 9 weeks and 5 days. In addition, gestational sac is slightly irregular and no detectable fetal heart rate.Findings are suspicious but not yet definitive for failed pregnancy. Recommend follow-up US in 10-14 days for definitive diagnosis. This recommendation follows SRU consensus guidelines: Diagnostic Criteria for Nonviable Pregnancy  Early in the First Trimester. Malva Limes Med 2013; 130:8657-84. Electronically Signed   By: Richarda Overlie M.D.   On: 02/19/2016 14:00   US Ob Transvaginal  02/19/2016  CLINICAL DATA:  Vaginal bleeding. Gestational age by LMP is 9 weeks and 5 days. EXAM: OBSTETRIC <14 WK [place] AND TRANSVAGINAL OB US TECHNIQUE: Both transabdominal and transvaginal ultrasound examinations were performed for complete evaluation of the gestation as well as the maternal uterus, adnexal regions, and pelvic cul-de-sac. Transvaginal technique was performed to assess early pregnancy. COMPARISON:  Pelvic ultrasound 12/26/2014 FINDINGS: Intrauterine gestational sac: Present Yolk sac:  Present Embryo:  Present Cardiac Activity: None CRL:  4.9  mm   6 w   1 d                  [place] EDC: 10/13/2016 Subchorionic hemorrhage:  None visualized. Maternal uterus/adnexae: Gestational sac is slightly enlarged and irregular in shape. The right ovary measures 3.1 x 2.7 x 1.3 cm with a normal appearance. Left ovary measures 3.3 x 3.4 x 2.6 cm. There is an anechoic structure within the left ovary that is suggestive for a corpus luteum cyst. No free fluid. IMPRESSION: Intrauterine pregnancy with a gestational age discrepancy between the LMP and ultrasound. Gestational age by ultrasound is 6 weeks and 1 day and gestational age by LMP is 9 weeks and 5 days. In addition, gestational sac is slightly irregular and no detectable  fetal heart rate.Findings are suspicious but not yet definitive for failed pregnancy. Recommend follow-up US in 10-14 days for definitive diagnosis. This recommendation follows SRU consensus guidelines: Diagnostic Criteria for Nonviable Pregnancy Early in the First Trimester. Malva Limes Med 2013; 696:2952-84. Electronically Signed   By: Richarda Overlie M.D.   On: 02/19/2016 14:00     MDM B positive Ultrasound shows SIUP without cardiac activity. Not yet definitive for failed pregnancy per radiologist.  Discussed ultrasound report with patient. Patient states she has her first appointment with [facility] next Tuesday. Recommend pt to f/u with them for reassessment and plan. F/u ultrasound recommended in 10 days  Assessment and Plan  A: 1. Threatened miscarriage   2. Vaginal bleeding in pregnancy, first trimester   3. Acute cystitis without hematuria     P: Discharge home Rx keflex Pelvic rest Discussed reasons to return Keep appt with ob  Judeth Horn 02/19/2016, 12:44 PM

## 2016-02-20 LAB — HIV ANTIBODY (ROUTINE TESTING W REFLEX): HIV Screen 4th Generation wRfx: NONREACTIVE

## 2016-02-20 LAB — GC/CHLAMYDIA PROBE AMP (~~LOC~~) NOT AT ARMC
Chlamydia: NEGATIVE
NEISSERIA GONORRHEA: NEGATIVE

## 2016-02-21 LAB — CULTURE, OB URINE: Culture: >=100000 — AB

## 2016-02-22 NOTE — H&P (Signed)
Diamond Patel  DICTATION # [id number] CSN# [id number]   Meriel PicaHOLLAND,Diamond Chinchilla M, MD 02/22/2016 8:11 AM

## 2016-02-26 MED ORDER — DEXTROSE 5 % IV SOLN
2.0000 g | INTRAVENOUS | Status: DC
  Filled 2016-02-26: qty 2

## 2016-02-27 ENCOUNTER — Ambulatory Visit (HOSPITAL_COMMUNITY)
Admission: RE | Admit: 2016-02-27 | Payer: BLUE CROSS/BLUE SHIELD | Source: Ambulatory Visit | Admitting: Obstetrics and Gynecology

## 2016-02-27 ENCOUNTER — Encounter (HOSPITAL_COMMUNITY): Admission: RE | Payer: Self-pay | Source: Ambulatory Visit

## 2016-02-27 SURGERY — DILATION AND EVACUATION, UTERUS
Anesthesia: Choice

## 2016-02-27 NOTE — H&P (Signed)
NAMIsabell Patel:  Schofield, Amariz                 ACCOUNT NO.:  [id number]  MEDICAL RECORD NO.:  [id number]  LOCATION:                                FACILITY:  [facility]  PHYSICIAN:  Duke Salviaichard M. Marcelle OverlieHolland, M.D.DATE OF BIRTH:  [birth year]  DATE OF ADMISSION:  02/27/2016 DATE OF DISCHARGE:                             HISTORY & PHYSICAL   CHIEF COMPLAINT:  Missed abortion.  HISTORY OF PRESENT ILLNESS:  A 30 year old, G2, 1P1, was seen at MAU in early May for some bleeding.  Ultrasound showed irregular sac, no fetal heart rate, was advised to return for followup ultrasound at our office. This was carried out on 05/10.  Our ultrasound showed small fetal pole, irregular sac, no FHR noted.  She presents now for D and E.  This procedure including specific risks related to bleeding, infection, other complications may require additional surgery.  All discussed with her which she understands and accepts.  PAST MEDICAL HISTORY:  Allergies:  None.  Prior surgical history:  None.  FAMILY HISTORY:  Significant for breast cancer.  SOCIAL HISTORY:  She denies drug use.  She does have a history of smoking and alcohol use.  PHYSICAL EXAMINATION:  VITAL SIGNS:  Temp 98.2, blood pressure 110/82. HEENT:  Unremarkable. NECK:  Supple without masses. LUNGS:  Clear. CARDIOVASCULAR:  Regular rate and rhythm without murmurs, rubs, gallops noted. BREASTS:  Without masses. ABDOMEN:  Soft, flat, nontender. GU:  Vulva, vagina, cervix normal.  Uterus 6 weeks' size.  Cervix closed.  Adnexa negative.  IMPRESSION:  Missed abortion.  PLAN:  D and E.  Procedure and risks reviewed as above.     Hadlie Gipson M. Marcelle OverlieHolland, M.D.    [facility]/MEDQ  D:  02/22/2016  T:  02/23/2016  Job:  [id number]

## 2016-04-15 IMAGING — US US TRANSVAGINAL NON-OB
1 series · 13 of 25 positions shown · non-contrast
Comparison: None

CLINICAL DATA: IUD, pelvic cramping and pain, IUD string not
visualized

EXAM:
TRANSABDOMINAL AND TRANSVAGINAL ULTRASOUND OF PELVIS
TECHNIQUE: Both transabdominal and transvaginal ultrasound examinations of the
pelvis were performed. Transabdominal technique was performed for
global imaging of the pelvis including uterus, ovaries, adnexal
regions, and pelvic cul-de-sac. It was necessary to proceed with
endovaginal exam following the transabdominal exam to visualize the
IUD, endometrium, and adnexa.

[Series 1: us transvaginal non-ob · 0.22mm/px · 13 of 79 slices shown]
[im 1/79]
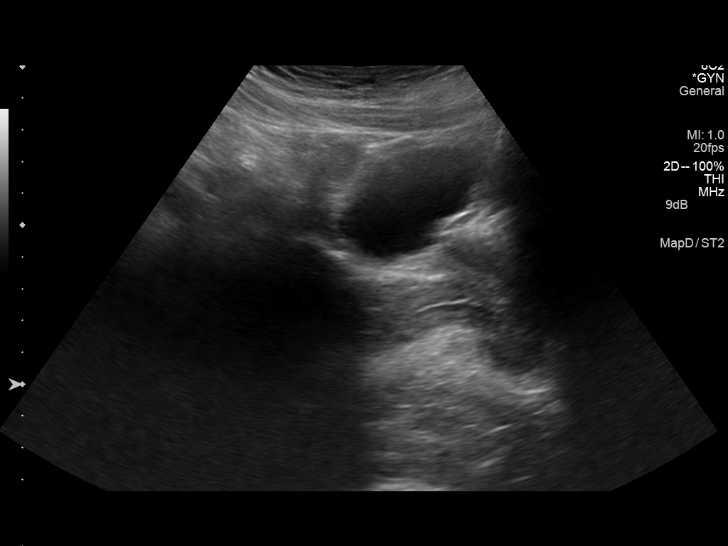
[im 7/79]
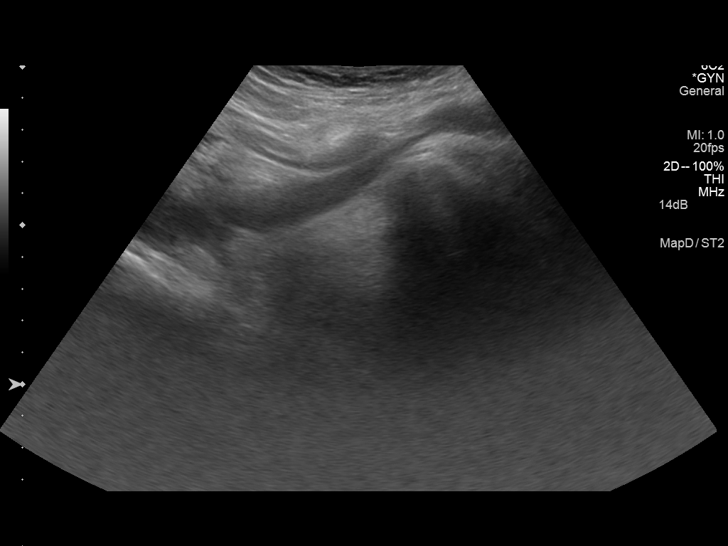
[im 14/79]
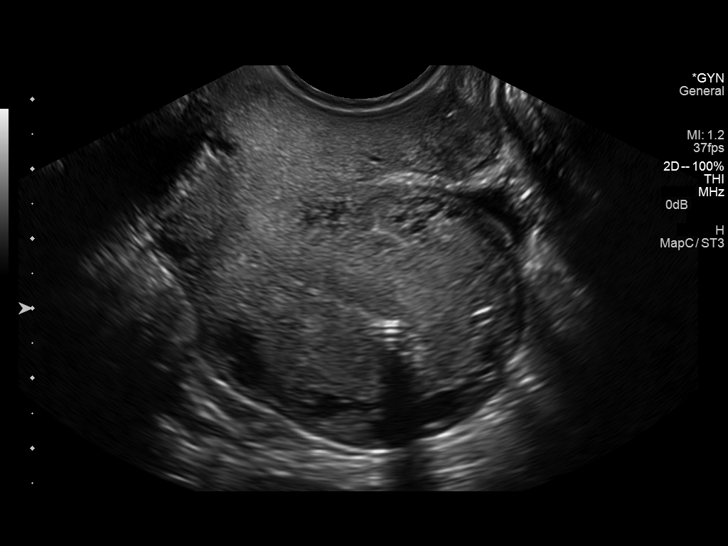
[im 20/79]
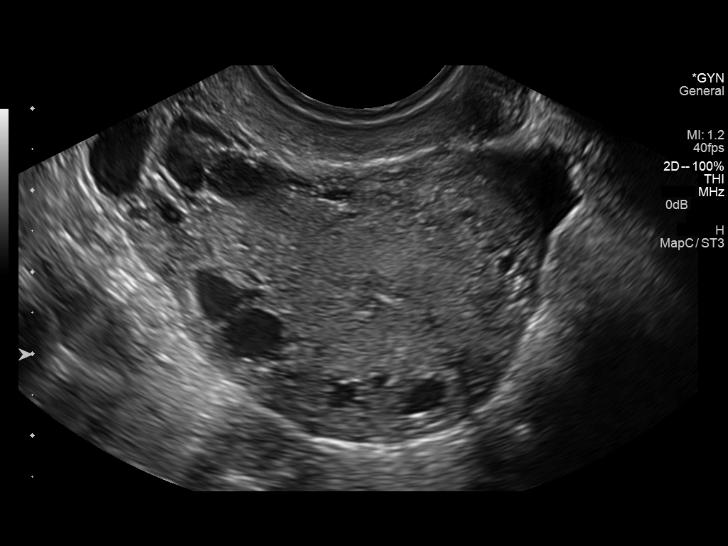
[im 27/79]
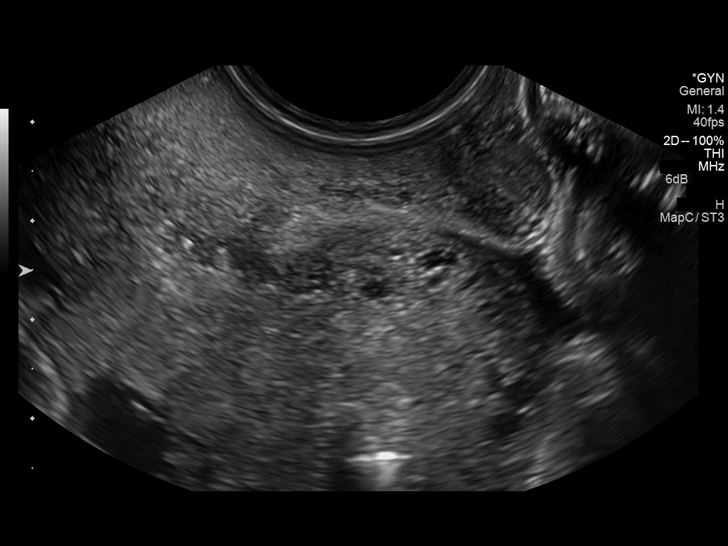
[im 33/79]
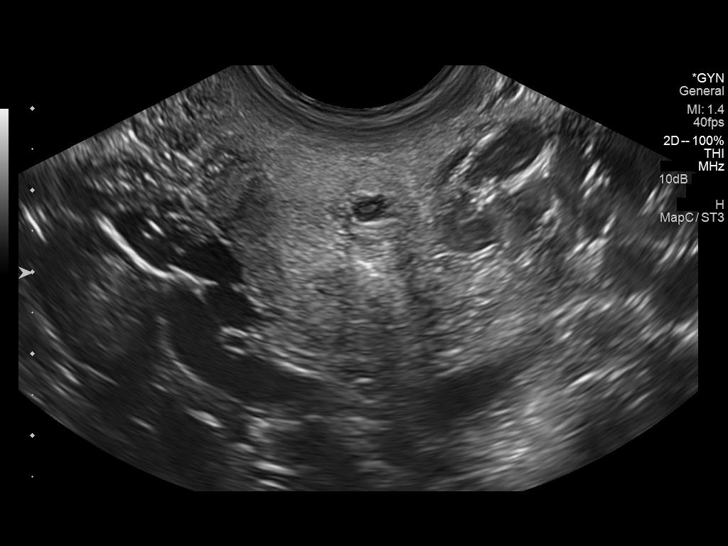
[im 40/79]
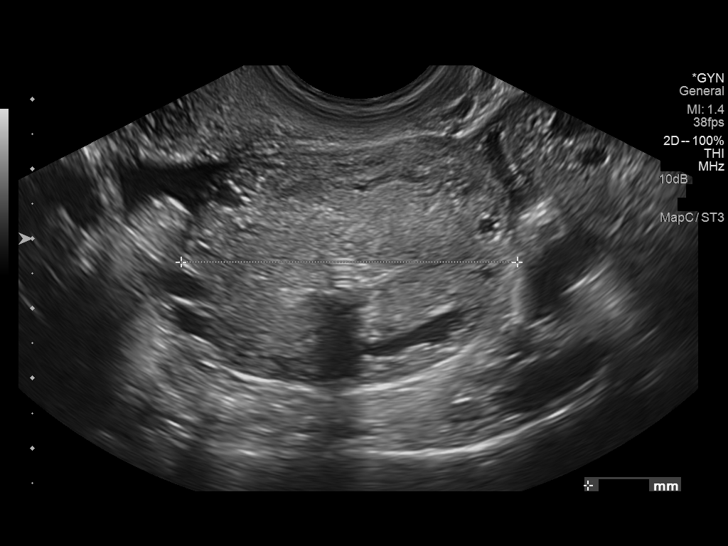
[im 46/79]
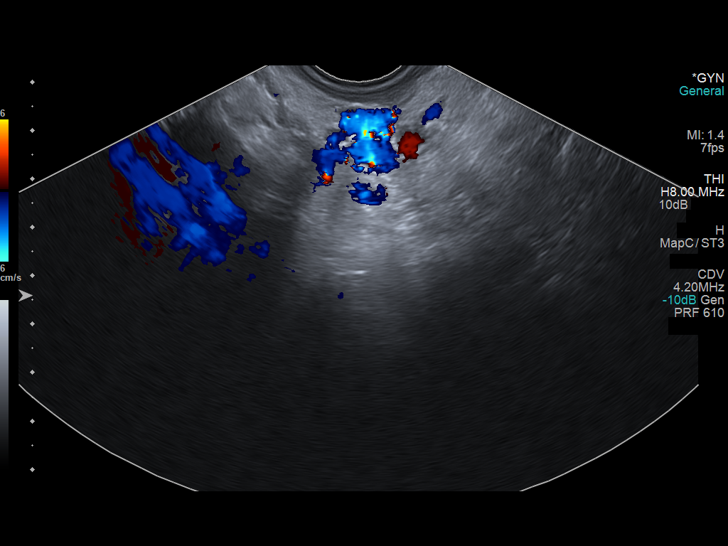
[im 53/79]
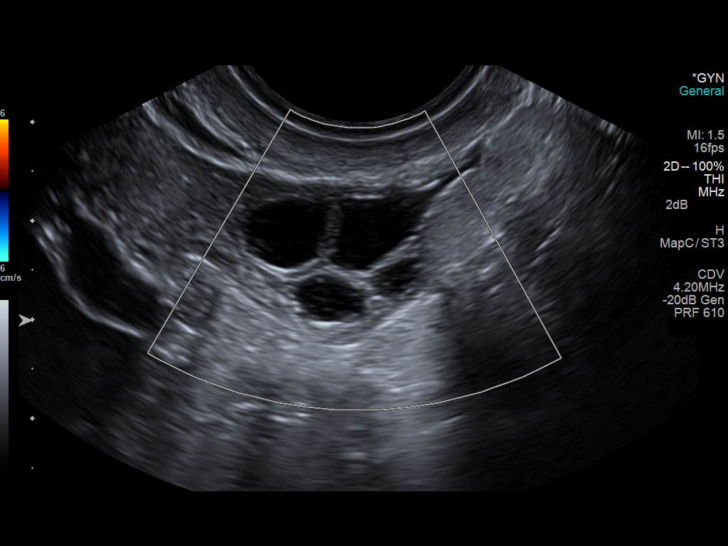
[im 59/79]
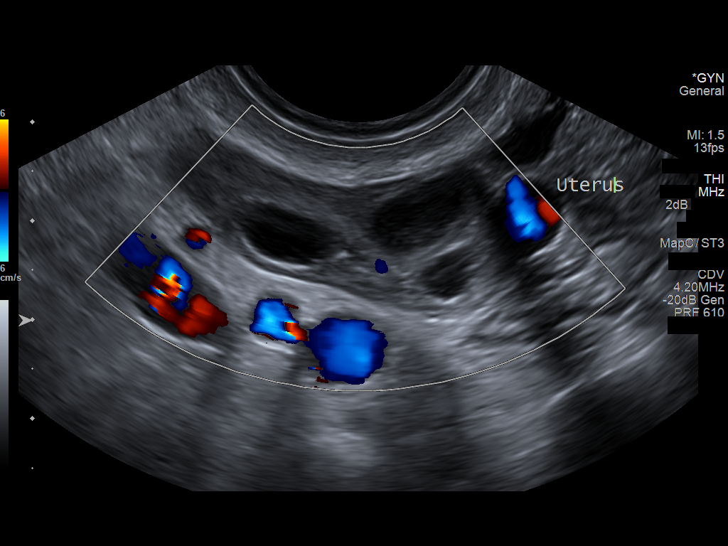
[im 66/79]
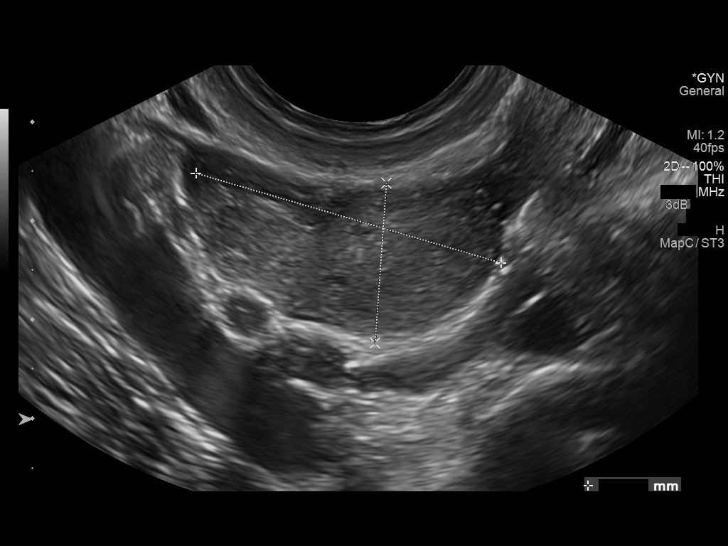
[im 72/79]
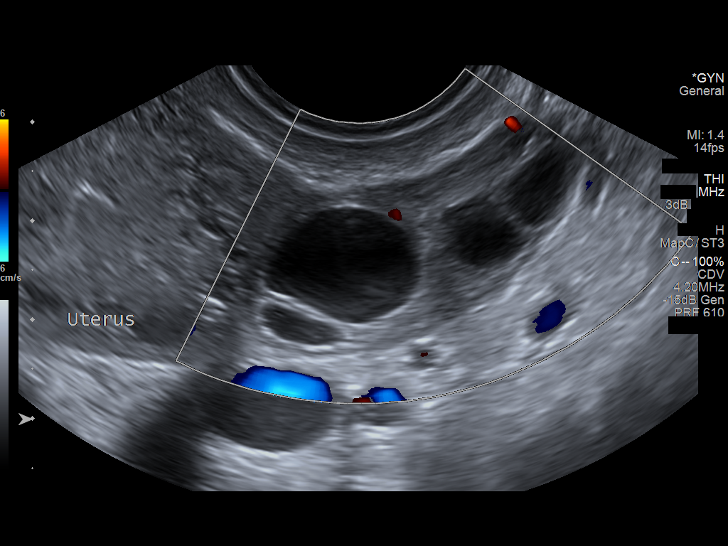
[im 79/79]
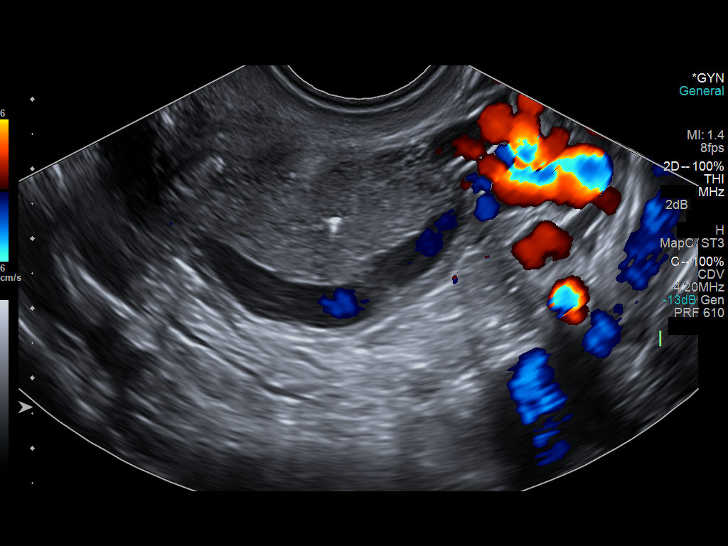

[13 of 25 positions shown; findings below may reference images not displayed]

FINDINGS: Uterus

Measurements: 8.8 x 3.9 x 4.8 cm. Retroverted. No focal uterine
mass. Prominent arcuate vessels within uterine wall.

Endometrium

Thickness: 7 mm thick, normal. No endometrial fluid or focal
abnormality. IUD identified within the endometrial canal extending
from the upper to lower uterine segments.

Right ovary

Measurements: 4.4 x 1.5 x 3.0 cm. Multiple follicle cysts.  No mass.

Left ovary

Measurements: 3.2 x 1.6 x 3.9 cm. Multiple follicle cysts.  No mass.

Other findings:

Small amount of nonspecific free pelvic fluid.

Prominent parametrial vessels in the adnexal regions bilaterally.
IMPRESSION: IUD within endometrial canal.

No uterine or ovarian mass.

Prominent parametrial vessels as well as prominent arcuate vessels
within the uterine wall, nonspecific but can be seen with pelvic
congestion syndrome.

## 2016-05-29 LAB — OB RESULTS CONSOLE RPR: RPR: NONREACTIVE

## 2016-05-29 LAB — OB RESULTS CONSOLE RUBELLA ANTIBODY, IGM: RUBELLA: IMMUNE

## 2016-05-29 LAB — OB RESULTS CONSOLE ABO/RH: RH TYPE: POSITIVE

## 2016-05-29 LAB — OB RESULTS CONSOLE ANTIBODY SCREEN: Antibody Screen: NEGATIVE

## 2016-05-29 LAB — OB RESULTS CONSOLE HIV ANTIBODY (ROUTINE TESTING): HIV: NONREACTIVE

## 2016-05-29 LAB — OB RESULTS CONSOLE HEPATITIS B SURFACE ANTIGEN: Hepatitis B Surface Ag: NEGATIVE

## 2016-06-13 LAB — OB RESULTS CONSOLE GC/CHLAMYDIA
Chlamydia: NEGATIVE
Gonorrhea: NEGATIVE

## 2016-07-04 ENCOUNTER — Inpatient Hospital Stay (HOSPITAL_COMMUNITY)
Admission: AD | Admit: 2016-07-04 | Discharge: 2016-07-04 | Disposition: A | Discharge status: Home / Self Care | Payer: BLUE CROSS/BLUE SHIELD | Source: Ambulatory Visit | Attending: Obstetrics & Gynecology | Admitting: Obstetrics & Gynecology

## 2016-07-04 ENCOUNTER — Encounter (HOSPITAL_COMMUNITY): Payer: Self-pay | Admitting: *Deleted

## 2016-07-04 DIAGNOSIS — F17210 Nicotine dependence, cigarettes, uncomplicated: Secondary | ICD-10-CM | POA: Insufficient documentation

## 2016-07-04 DIAGNOSIS — O4692 Antepartum hemorrhage, unspecified, second trimester: Secondary | ICD-10-CM | POA: Diagnosis present

## 2016-07-04 DIAGNOSIS — Z3A15 15 weeks gestation of pregnancy: Secondary | ICD-10-CM | POA: Insufficient documentation

## 2016-07-04 DIAGNOSIS — Z79899 Other long term (current) drug therapy: Secondary | ICD-10-CM | POA: Insufficient documentation

## 2016-07-04 DIAGNOSIS — O99332 Smoking (tobacco) complicating pregnancy, second trimester: Secondary | ICD-10-CM | POA: Diagnosis not present

## 2016-07-04 DIAGNOSIS — N888 Other specified noninflammatory disorders of cervix uteri: Secondary | ICD-10-CM

## 2016-07-04 LAB — URINALYSIS, ROUTINE W REFLEX MICROSCOPIC
BILIRUBIN URINE: NEGATIVE
GLUCOSE, UA: NEGATIVE mg/dL
KETONES UR: 15 mg/dL — AB
Nitrite: NEGATIVE
PH: 6 (ref 5.0–8.0)
PROTEIN: NEGATIVE mg/dL
Specific Gravity, Urine: 1.025 (ref 1.005–1.030)

## 2016-07-04 LAB — URINE MICROSCOPIC-ADD ON

## 2016-07-04 LAB — POCT PREGNANCY, URINE: Preg Test, Ur: POSITIVE — AB

## 2016-07-04 NOTE — MAU Note (Signed)
Pt reports  She had a miscarriage in May and is pregnant again. Sarted having diarrhea for the past 2-3 days. Went to the bathroom this afternoon and had blood when she wiped. Denies abd cramping.

## 2016-07-04 NOTE — Discharge Instructions (Signed)
Vaginal Bleeding During Pregnancy, Second Trimester A small amount of bleeding (spotting) from the vagina is relatively common in pregnancy. It usually stops on its own. Various things can cause bleeding or spotting in pregnancy. Some bleeding may be related to the pregnancy, and some may not. Sometimes the bleeding is normal and is not a problem. However, bleeding can also be a sign of something serious. Be sure to tell your health care provider about any vaginal bleeding right away. Some possible causes of vaginal bleeding during the second trimester include:  Infection, inflammation, or growths on the cervix.   The placenta may be partially or completely covering the opening of the cervix inside the uterus (placenta previa).  The placenta may have separated from the uterus (abruption of the placenta).   You may be having early (preterm) labor.   The cervix may not be strong enough to keep a baby inside the uterus (cervical insufficiency).   Tiny cysts may have developed in the uterus instead of pregnancy tissue (molar pregnancy). HOME CARE INSTRUCTIONS  Watch your condition for any changes. The following actions may help to lessen any discomfort you are feeling:  Follow your health care provider's instructions for limiting your activity. If your health care provider orders bed rest, you may need to stay in bed and only get up to use the bathroom. However, your health care provider may allow you to continue light activity.  If needed, make plans for someone to help with your regular activities and responsibilities while you are on bed rest.  Keep track of the number of pads you use each day, how often you change pads, and how soaked (saturated) they are. Write this down.  Do not use tampons. Do not douche.  Do not have sexual intercourse or orgasms until approved by your health care provider.  If you pass any tissue from your vagina, save the tissue so you can show it to your  health care provider.  Only take over-the-counter or prescription medicines as directed by your health care provider.  Do not take aspirin because it can make you bleed.  Do not exercise or perform any strenuous activities or heavy lifting without your health care provider's permission.  Keep all follow-up appointments as directed by your health care provider. SEEK MEDICAL CARE IF:  You have any vaginal bleeding during any part of your pregnancy.  You have cramps or labor pains.  You have a fever, not controlled by medicine. SEEK IMMEDIATE MEDICAL CARE IF:   You have severe cramps in your back or belly (abdomen).  You have contractions.  You have chills.  You pass large clots or tissue from your vagina.  Your bleeding increases.  You feel light-headed or weak, or you have fainting episodes.  You are leaking fluid or have a gush of fluid from your vagina. MAKE SURE YOU:  Understand these instructions.  Will watch your condition.  Will get help right away if you are not doing well or get worse.   This information is not intended to replace advice given to you by your health care provider. Make sure you discuss any questions you have with your health care provider.   Document Released: 07/10/2005 Document Revised: 10/05/2013 Document Reviewed: 06/07/2013 Elsevier Interactive Patient Education 2016 [employer]  

## 2016-07-04 NOTE — MAU Provider Note (Signed)
Chief Complaint:  Vaginal Bleeding   First Provider Initiated Contact with Patient 07/04/16 1926     HPI  HPI: Diamond Patel is a 30 y.o. [id number] at 6615w2dwho presents to maternity admissions reporting diarrhea and seeing blood when she wiped. THinks it is vaginal. She reports good fetal movement, denies LOF, vaginal itching/burning, urinary symptoms, h/a, dizziness, n/v,  constipation or fever/chills.  She denies headache, visual changes or RUQ abdominal pain.  RN Note: Pt reports  She had a miscarriage in May and is pregnant again. Sarted having diarrhea for the past 2-3 days. Went to the bathroom this afternoon and had blood when she wiped. Denies abd cramping  Past Medical History: Past Medical History:  Diagnosis Date  . Hypertension     Past obstetric history: OB History  Gravida Para Term Preterm AB Living  3 1 0 1 1 1   SAB TAB Ectopic Multiple Live Births  1       1    # Outcome Date GA Lbr Len/2nd Weight Sex Delivery Anes PTL Lv  3 Current           2 SAB 02/2016          1 Preterm 04/07/10 6369w0d   F Vag-Spont   LIV      Past Surgical History: History reviewed. No pertinent surgical history.  Family History: Family History  Problem Relation Age of Onset  . Hypertension Father   . Hypertension Mother     Social History: Social History  Substance Use Topics  . Smoking status: Current Some Day Smoker    Packs/day: 0.10    Types: Cigarettes  . Smokeless tobacco: Never Used  . Alcohol use Yes     Comment: occasionally    Allergies: No Known Allergies  Meds:  Prescriptions Prior to Admission  Medication Sig Dispense Refill Last Dose  . Prenatal Vit-Fe Fumarate-FA (PRENATAL MULTIVITAMIN) TABS tablet Take 1 tablet by mouth daily at 12 noon.   07/04/2016 at Unknown time  . cephALEXin (KEFLEX) 500 MG capsule Take 1 capsule (500 mg total) by mouth 4 (four) times daily. (Patient not taking: Reported on 07/04/2016) 28 capsule 0 Not Taking at Unknown time  .  metroNIDAZOLE (FLAGYL) 500 MG tablet Take 1 tablet (500 mg total) by mouth 2 (two) times daily. 14 tablet 3     I have reviewed patient's Past Medical Hx, Surgical Hx, Family Hx, Social Hx, medications and allergies.   ROS:  Review of Systems  Constitutional: Negative for chills and fever.  Respiratory: Negative for shortness of breath.   Gastrointestinal: Positive for diarrhea. Negative for abdominal pain, constipation, nausea and vomiting.  Genitourinary: Positive for vaginal bleeding. Negative for dysuria, flank pain and vaginal discharge.  Musculoskeletal: Negative for back pain.   Other systems negative  Physical Exam  Patient Vitals for the past 24 hrs:  BP Temp Temp src Pulse Resp Height Weight  07/04/16 1901 155/87 99 F (37.2 C) Oral 90 18 5\' 4"  (1.626 m) 168 lb (76.2 kg)   Constitutional: Well-developed, well-nourished female in no acute distress.  Cardiovascular: normal rate and rhythm Respiratory: normal effort, clear to auscultation bilaterally GI: Abd soft, non-tender, gravid appropriate for gestational age.   No rebound or guarding. MS: Extremities nontender, no edema, normal ROM Neurologic: Alert and oriented x 4.  GU: Neg CVAT.  PELVIC EXAM: Cervix pink, visually closed, without lesion, scant white creamy discharge, vaginal walls and external genitalia normal There is a tiny spot of friability at  6:00 on cervical surface, bled with touch of swab.  No blood coming from os  Bimanual exam: Cervix firm, posterior, neg CMT, uterus nontender, Fundal Height consistent with dates, adnexa without tenderness, enlargement, or mass  Bedside US done:  Single fetus, HR 150s, movement seen. Normal fluid, ?lateral placenta   Labs: Results for orders placed or performed during the hospital encounter of 07/04/16 (from the past 24 hour(s))  Urinalysis, Routine w reflex microscopic (not at [facility])     Status: Abnormal   Collection Time: 07/04/16  7:03 PM  Result Value Ref Range    Color, Urine YELLOW YELLOW   APPearance CLEAR CLEAR   Specific Gravity, Urine 1.025 1.005 - 1.030   pH 6.0 5.0 - 8.0   Glucose, UA NEGATIVE NEGATIVE mg/dL   Hgb urine dipstick LARGE (A) NEGATIVE   Bilirubin Urine NEGATIVE NEGATIVE   Ketones, ur 15 (A) NEGATIVE mg/dL   Protein, ur NEGATIVE NEGATIVE mg/dL   Nitrite NEGATIVE NEGATIVE   Leukocytes, UA TRACE (A) NEGATIVE  Urine microscopic-add on     Status: Abnormal   Collection Time: 07/04/16  7:03 PM  Result Value Ref Range   Squamous Epithelial / LPF 6-30 (A) NONE SEEN   WBC, UA 0-5 0 - 5 WBC/hpf   RBC / HPF 6-30 0 - 5 RBC/hpf   Bacteria, UA FEW (A) NONE SEEN   Urine-Other MUCOUS PRESENT   Pregnancy, urine POC     Status: Abnormal   Collection Time: 07/04/16  7:25 PM  Result Value Ref Range   Preg Test, Ur POSITIVE (A) NEGATIVE   --/--/B POS (03/16 1536)  Imaging:  No results found.  MAU Course/MDM: I have ordered labs and reviewed results.  NST reviewed Consult Dr Langston Masker with presentation, exam findings and test results.  Treatments in MAU included speculum exam, bedside US  Assessment: SIUP at [redacted]w[redacted]d Antepartum bleeding, second trimester  Friable cervix   Plan: Discharge home Recommend pelvic rest until 2 weeks with no bleeding Follow up in Office for prenatal visits and recheck of bleeding  Encouraged to return here or to other Urgent Care/ED if she develops worsening of symptoms, increase in pain, fever, or other concerning symptoms.    Pt stable at time of discharge.  Wynelle Bourgeois CNM, MSN Certified Nurse-Midwife 07/04/2016 8:19 PM

## 2016-10-10 ENCOUNTER — Encounter (HOSPITAL_COMMUNITY): Payer: Self-pay | Admitting: *Deleted

## 2016-10-10 ENCOUNTER — Inpatient Hospital Stay (HOSPITAL_COMMUNITY)
Admission: AD | Admit: 2016-10-10 | Discharge: 2016-10-11 | Discharge status: Against Medical Advice | Payer: BLUE CROSS/BLUE SHIELD | Source: Ambulatory Visit | Attending: Obstetrics and Gynecology | Admitting: Obstetrics and Gynecology

## 2016-10-10 DIAGNOSIS — O26893 Other specified pregnancy related conditions, third trimester: Secondary | ICD-10-CM | POA: Diagnosis not present

## 2016-10-10 DIAGNOSIS — O99333 Smoking (tobacco) complicating pregnancy, third trimester: Secondary | ICD-10-CM | POA: Insufficient documentation

## 2016-10-10 DIAGNOSIS — R51 Headache: Secondary | ICD-10-CM | POA: Insufficient documentation

## 2016-10-10 DIAGNOSIS — Z8249 Family history of ischemic heart disease and other diseases of the circulatory system: Secondary | ICD-10-CM | POA: Insufficient documentation

## 2016-10-10 DIAGNOSIS — Z3A29 29 weeks gestation of pregnancy: Secondary | ICD-10-CM | POA: Insufficient documentation

## 2016-10-10 DIAGNOSIS — F17210 Nicotine dependence, cigarettes, uncomplicated: Secondary | ICD-10-CM | POA: Diagnosis not present

## 2016-10-10 DIAGNOSIS — Z5321 Procedure and treatment not carried out due to patient leaving prior to being seen by health care provider: Secondary | ICD-10-CM | POA: Insufficient documentation

## 2016-10-10 LAB — URINALYSIS, ROUTINE W REFLEX MICROSCOPIC
Bilirubin Urine: NEGATIVE
Glucose, UA: NEGATIVE mg/dL
Ketones, ur: 15 mg/dL — AB
Nitrite: NEGATIVE
Protein, ur: NEGATIVE mg/dL
Specific Gravity, Urine: 1.025 (ref 1.005–1.030)
pH: 6 (ref 5.0–8.0)

## 2016-10-10 LAB — PROTEIN / CREATININE RATIO, URINE
Creatinine, Urine: 206 mg/dL
Protein Creatinine Ratio: 0.1 mg/mg{creat} (ref 0.00–0.15)
Total Protein, Urine: 20 mg/dL

## 2016-10-10 LAB — URINALYSIS, MICROSCOPIC (REFLEX)

## 2016-10-10 MED ORDER — BUTALBITAL-APAP-CAFFEINE 50-325-40 MG PO TABS
2.0000 | ORAL_TABLET | Freq: Once | ORAL | Status: DC
  Filled 2016-10-10: qty 2

## 2016-10-10 NOTE — MAU Note (Signed)
PT  SAYS  SHE HAS BAD  H/A- STARTED  AT 6 PM-    TOOK REG  STR  TYLENOL  2  TABS -  NO RELIEF.     FEELS  LIKE  H/A   WORSE . NO HX OF  H/A.   HAS HX  OF HIGH BP- BUT  NOT  WITH  THIS  PREG.      CRAMPING  STARTED  AT 7PM.    DIZZY STARTED AT  6  WITH H/A.       SHE WAS BLOW DRYING  HAIR  AND  FELT  LIKE  SOMETHING  WRONG.       [facility]-  DR HOLLAND - LAST VISIT  WAS 12-14- ALL OK.        LAST SEX-   LAST TUESDAY

## 2016-10-10 NOTE — MAU Note (Signed)
Previous RN gave report to current RN stating that pt cannot have fioricet because pt is driving herself and does not have anybody else that can pick her up. Also told previous RN that she is unable to swallow pills. Pt called current RN into room-pt states that she has been waiting for pain medicine for an hour and that no one has offered her any pain medication and that she has been sitting in pain and that she is going to leave. RN made patient aware that she can ask for another pain medication and that we are still waiting on lab results. Pt states that she does not want to wait for lab results and does not want to wait for any pain medication. RN asked pt to sign AMA form-pt refused saying "she is not signing a damn thing." Pt left AMA.

## 2016-10-10 NOTE — MAU Provider Note (Signed)
History     CSN: [id number]  Arrival date and time: 10/10/16 2149   First Provider Initiated Contact with Patient 10/10/16 2233      Chief Complaint  Patient presents with  . Headache   Diamond Patel is a 30 y.o. [id number] at 8163w2d who presents today with headache. She denies any contractions, VB or LOF. She reports normal fetal movement.    Headache   This is a new problem. The current episode started today. The problem occurs constantly. The problem has been unchanged. The pain is located in the right unilateral region. The pain does not radiate. The pain quality is not similar to prior headaches. The pain is at a severity of 9/10. Associated symptoms include blurred vision, dizziness, nausea and vomiting ("this is normal for me"). Pertinent negatives include no abdominal pain or fever. Nothing aggravates the symptoms. She has tried acetaminophen (took tylenol around 1800) for the symptoms. The treatment provided no relief.   Past Medical History:  Diagnosis Date  . Hypertension     History reviewed. No pertinent surgical history.  Family History  Problem Relation Age of Onset  . Hypertension Father   . Hypertension Mother     Social History  Substance Use Topics  . Smoking status: Current Some Day Smoker    Packs/day: 0.10    Types: Cigarettes  . Smokeless tobacco: Never Used  . Alcohol use Yes     Comment: occasionally    Allergies: No Known Allergies  Prescriptions Prior to Admission  Medication Sig Dispense Refill Last Dose  . acetaminophen (TYLENOL) 325 MG tablet Take 650 mg by mouth every 6 (six) hours as needed for mild pain or headache.   10/10/2016 at 1800  . Prenatal Vit-Fe Fumarate-FA (PRENATAL MULTIVITAMIN) TABS tablet Take 1 tablet by mouth daily at 12 noon.   10/10/2016 at Unknown time    Review of Systems  Constitutional: Negative for chills and fever.  Eyes: Positive for blurred vision.  Gastrointestinal: Positive for diarrhea, nausea and vomiting  ("this is normal for me"). Negative for abdominal pain and constipation.  Neurological: Positive for dizziness and headaches.   Physical Exam   Blood pressure 139/85, pulse 102, temperature 98.2 F (36.8 C), temperature source Oral, resp. rate 20, height 5\' 3"  (1.6 m), weight 178 lb 8 oz (81 kg), last menstrual period 03/19/2016, unknown if currently breastfeeding.  Physical Exam  Nursing note and vitals reviewed. Constitutional: She is oriented to person, place, and time. She appears well-developed and well-nourished. No distress.  HENT:  Head: Normocephalic.  Cardiovascular: Normal rate.   Respiratory: Effort normal.  GI: Soft. There is no tenderness. There is no rebound.  Musculoskeletal: Normal range of motion.  Neurological: She is alert and oriented to person, place, and time. She has normal reflexes. She exhibits normal muscle tone (no clonus ).  Skin: Skin is warm and dry.  Psychiatric: She has a normal mood and affect.   FHT: 150, moderate with 10x10 accels no decels Toco: no UCs   Results for orders placed or performed during the hospital encounter of 10/10/16 (from the past 24 hour(s))  Urinalysis, Routine w reflex microscopic     Status: Abnormal   Collection Time: 10/10/16 10:08 PM  Result Value Ref Range   Color, Urine YELLOW YELLOW   APPearance CLEAR CLEAR   Specific Gravity, Urine 1.025 1.005 - 1.030   pH 6.0 5.0 - 8.0   Glucose, UA NEGATIVE NEGATIVE mg/dL   Hgb urine dipstick SMALL (A)  NEGATIVE   Bilirubin Urine NEGATIVE NEGATIVE   Ketones, ur 15 (A) NEGATIVE mg/dL   Protein, ur NEGATIVE NEGATIVE mg/dL   Nitrite NEGATIVE NEGATIVE   Leukocytes, UA SMALL (A) NEGATIVE  Urinalysis, Microscopic (reflex)     Status: Abnormal   Collection Time: 10/10/16 10:08 PM  Result Value Ref Range   RBC / HPF 0-5 0 - 5 RBC/hpf   WBC, UA 0-5 0 - 5 WBC/hpf   Bacteria, UA FEW (A) NONE SEEN   Squamous Epithelial / LPF 0-5 (A) NONE SEEN  Protein / creatinine ratio, urine      Status: None   Collection Time: 10/10/16 10:08 PM  Result Value Ref Range   Creatinine, Urine 206.00 mg/dL   Total Protein, Urine 20 mg/dL   Protein Creatinine Ratio 0.10 0.00 - 0.15 mg/mg[Cre]    MAU Course  Procedures  MDM Patient refuses to have her blood drawn today.  Patient does not have a ride home, and is refusing any pain medication for her headache.  2344: Patient removed herself from the monitor, and left AMA. She refused to sign AMA paper. 0116: D/W Dr. Marcelle Patel and informed that she left AMA.    Assessment and Plan  Headache in pregnancy Patient left AMA  Diamond Patel, Diamond Patel 10/10/2016, 10:36 PM

## 2016-10-11 DIAGNOSIS — O26893 Other specified pregnancy related conditions, third trimester: Secondary | ICD-10-CM | POA: Diagnosis not present

## 2016-10-14 NOTE — L&D Delivery Note (Signed)
30 y.o. [id number] at 7320w4d delivered a viable female infant in cephalic, LOA position. Anterior shoulder delivered with ease. 60 sec delayed cord clamping. Cord clamped x2 and cut. Placenta delivered spontaneously intact, with 3VC. Fundus firm on exam with massage and pitocin. Good hemostasis noted.  NICU team present for delivery.   Laceration: bilateral periuretral Suture: none Good hemostasis noted. EBL 150cc  Mom recovering in LDR and baby transferred to NICU.   Apgars:pending NICU team assessment Weight: pending    Renne Muscaaniel L Warden, MD PGY-1 11/16/2016, 3:18 PM  OB FELLOW DELIVERY ATTESTATION  I was gloved and present for the delivery in its entirety, and I agree with the above resident's note.    Ernestina PennaNicholas Schenk, MD 5:10 PM

## 2016-10-17 ENCOUNTER — Encounter: Payer: BLUE CROSS/BLUE SHIELD | Admitting: Family Medicine

## 2016-11-12 ENCOUNTER — Encounter: Payer: Self-pay | Admitting: Obstetrics & Gynecology

## 2016-11-12 ENCOUNTER — Ambulatory Visit (INDEPENDENT_AMBULATORY_CARE_PROVIDER_SITE_OTHER): Payer: BLUE CROSS/BLUE SHIELD | Admitting: Obstetrics & Gynecology

## 2016-11-12 VITALS — BP 135/84 | HR 93 | Temp 99.3°F | Wt 179.8 lb

## 2016-11-12 DIAGNOSIS — O09299 Supervision of pregnancy with other poor reproductive or obstetric history, unspecified trimester: Secondary | ICD-10-CM | POA: Insufficient documentation

## 2016-11-12 DIAGNOSIS — Z3480 Encounter for supervision of other normal pregnancy, unspecified trimester: Secondary | ICD-10-CM

## 2016-11-12 DIAGNOSIS — O09293 Supervision of pregnancy with other poor reproductive or obstetric history, third trimester: Secondary | ICD-10-CM

## 2016-11-12 DIAGNOSIS — Z3490 Encounter for supervision of normal pregnancy, unspecified, unspecified trimester: Secondary | ICD-10-CM | POA: Insufficient documentation

## 2016-11-12 MED ORDER — BETAMETHASONE SOD PHOS & ACET 6 (3-3) MG/ML IJ SUSP
12.0000 mg | INTRAMUSCULAR | Status: AC
  Administered 2016-11-12 – 2016-11-13 (×2): 12 mg via INTRAMUSCULAR

## 2016-11-12 NOTE — Progress Notes (Signed)
   PRENATAL VISIT NOTE, Initial OB after transfer from [facility]  Subjective:  Diamond Patel is a 31 y.o. [id number] at 4348w0d being seen today for ongoing prenatal care.  She is currently monitored for the following issues for this high-risk pregnancy and has Malpositioned intrauterine device (IUD); Supervision of normal pregnancy, antepartum; and History of preterm PROM in previous pregnancy, currently pregnant on her problem list.  Patient reports occasional contractions.  Contractions: Irregular. Vag. Bleeding: None.  Movement: Present. Denies leaking of fluid.   The following portions of the patient's history were reviewed and updated as appropriate: allergies, current medications, past family history, past medical history, past social history, past surgical history and problem list. Problem list updated.  Objective:   Vitals:   11/12/16 1321  BP: 135/84  Pulse: 93  Temp: 99.3 F (37.4 C)  Weight: 179 lb 12.8 oz (81.6 kg)    Fetal Status:     Movement: Present     General:  Alert, oriented and cooperative. Patient is in no acute distress.  Skin: Skin is warm and dry. No rash noted.   Cardiovascular: Normal heart rate noted  Respiratory: Normal respiratory effort, no problems with respiration noted  Abdomen: Soft, gravid, appropriate for gestational age. Pain/Pressure: Absent     Pelvic:  Cervical exam performed        Extremities: Normal range of motion.  Edema: None  Mental Status: Normal mood and affect. Normal behavior. Normal judgment and thought content.   Assessment and Plan:  Pregnancy: [id number] at 5448w0d  1. History of preterm premature rupture of membranes (PROM) in previous pregnancy, currently pregnant in third trimester Dilated cervix, at risk for PTB  2. Supervision of other normal pregnancy, antepartum Transfer care - ToxASSURE Select 13 (MW), Urine - GC[facility]Chlamydia probe amp ()[facility] - betamethasone acetate-betamethasone sodium phosphate (CELESTONE)  injection 12 mg; Inject 2 mLs (12 mg total) into the muscle every 24 hours x 2 doses.  Preterm labor symptoms and general obstetric precautions including but not limited to vaginal bleeding, contractions, leaking of fluid and fetal movement were reviewed in detail with the patient. Please refer to After Visit Summary for other counseling recommendations.  Return in about 1 week (around 11/19/2016).   Adam PhenixJames G Katielynn Horan, MD

## 2016-11-12 NOTE — Progress Notes (Signed)
Patient is in the office, reports good fetal movement. 

## 2016-11-13 ENCOUNTER — Ambulatory Visit (INDEPENDENT_AMBULATORY_CARE_PROVIDER_SITE_OTHER): Payer: BLUE CROSS/BLUE SHIELD

## 2016-11-13 ENCOUNTER — Ambulatory Visit: Payer: BLUE CROSS/BLUE SHIELD

## 2016-11-13 VITALS — BP 148/90 | HR 106

## 2016-11-13 DIAGNOSIS — O09213 Supervision of pregnancy with history of pre-term labor, third trimester: Secondary | ICD-10-CM | POA: Diagnosis not present

## 2016-11-13 DIAGNOSIS — Z3483 Encounter for supervision of other normal pregnancy, third trimester: Secondary | ICD-10-CM

## 2016-11-13 NOTE — Progress Notes (Signed)
Patient is in the office for 2nd betamethasone injection, administered and patient tolerated well. Patient stated that her BP was elevated today because she has been stressed and rushing all day, advised of signs/symptoms of elevated BP and to go to the hospital if they occur. Patient will schedule follow up appt for next week .Marland Kitchen. Administrations This Visit    betamethasone acetate-betamethasone sodium phosphate (CELESTONE) injection 12 mg    Admin Date 11/13/2016 Action Given Dose 12 mg Route Intramuscular Administered By Katrina StackBrittany D Itzy Adler, RN

## 2016-11-14 LAB — STREP GP B NAA: STREP GROUP B AG: NEGATIVE

## 2016-11-14 LAB — OB RESULTS CONSOLE GBS: STREP GROUP B AG: NEGATIVE

## 2016-11-15 ENCOUNTER — Inpatient Hospital Stay (HOSPITAL_COMMUNITY)
Admission: AD | Admit: 2016-11-15 | Discharge: 2016-11-18 | DRG: 774 | Disposition: A | Discharge status: Home / Self Care | Payer: BLUE CROSS/BLUE SHIELD | Source: Ambulatory Visit | Attending: Obstetrics & Gynecology | Admitting: Obstetrics & Gynecology

## 2016-11-15 ENCOUNTER — Encounter (HOSPITAL_COMMUNITY): Payer: Self-pay | Admitting: *Deleted

## 2016-11-15 DIAGNOSIS — O1002 Pre-existing essential hypertension complicating childbirth: Secondary | ICD-10-CM | POA: Diagnosis present

## 2016-11-15 DIAGNOSIS — O10919 Unspecified pre-existing hypertension complicating pregnancy, unspecified trimester: Secondary | ICD-10-CM | POA: Diagnosis present

## 2016-11-15 DIAGNOSIS — O42013 Preterm premature rupture of membranes, onset of labor within 24 hours of rupture, third trimester: Secondary | ICD-10-CM | POA: Diagnosis present

## 2016-11-15 DIAGNOSIS — Z8249 Family history of ischemic heart disease and other diseases of the circulatory system: Secondary | ICD-10-CM | POA: Diagnosis not present

## 2016-11-15 DIAGNOSIS — O42019 Preterm premature rupture of membranes, onset of labor within 24 hours of rupture, unspecified trimester: Secondary | ICD-10-CM

## 2016-11-15 DIAGNOSIS — O1092 Unspecified pre-existing hypertension complicating childbirth: Secondary | ICD-10-CM | POA: Diagnosis not present

## 2016-11-15 DIAGNOSIS — Z87891 Personal history of nicotine dependence: Secondary | ICD-10-CM

## 2016-11-15 DIAGNOSIS — O42919 Preterm premature rupture of membranes, unspecified as to length of time between rupture and onset of labor, unspecified trimester: Secondary | ICD-10-CM | POA: Diagnosis present

## 2016-11-15 DIAGNOSIS — Z3A34 34 weeks gestation of pregnancy: Secondary | ICD-10-CM

## 2016-11-15 LAB — COMPREHENSIVE METABOLIC PANEL
ALBUMIN: 3.3 g/dL — AB (ref 3.5–5.0)
ALT: 9 U/L — AB (ref 14–54)
AST: 14 U/L — ABNORMAL LOW (ref 15–41)
Alkaline Phosphatase: 98 U/L (ref 38–126)
Anion gap: 10 (ref 5–15)
BUN: 5 mg/dL — AB (ref 6–20)
CHLORIDE: 103 mmol/L (ref 101–111)
CO2: 21 mmol/L — AB (ref 22–32)
CREATININE: 0.39 mg/dL — AB (ref 0.44–1.00)
Calcium: 9.1 mg/dL (ref 8.9–10.3)
GFR calc Af Amer: >60 mL/min (ref >60)
GFR calc non Af Amer: >60 mL/min (ref >60)
GLUCOSE: 89 mg/dL (ref 65–99)
Potassium: 3.6 mmol/L (ref 3.5–5.1)
Sodium: 134 mmol/L — ABNORMAL LOW (ref 135–145)
Total Bilirubin: 0.3 mg/dL (ref 0.3–1.2)
Total Protein: 6.4 g/dL — ABNORMAL LOW (ref 6.5–8.1)

## 2016-11-15 LAB — CBC
HEMATOCRIT: 37.4 % (ref 36.0–46.0)
HEMOGLOBIN: 13 g/dL (ref 12.0–15.0)
MCH: 29 pg (ref 26.0–34.0)
MCHC: 34.8 g/dL (ref 30.0–36.0)
MCV: 83.5 fL (ref 78.0–100.0)
Platelets: 183 10*3/uL (ref 150–400)
RBC: 4.48 MIL/uL (ref 3.87–5.11)
RDW: 13.3 % (ref 11.5–15.5)
WBC: 16.2 10*3/uL — AB (ref 4.0–10.5)

## 2016-11-15 LAB — TYPE AND SCREEN
ABO/RH(D): B POS
Antibody Screen: NEGATIVE

## 2016-11-15 LAB — PROTEIN / CREATININE RATIO, URINE
Creatinine, Urine: 96 mg/dL
Protein Creatinine Ratio: 0.18 mg/mg{Cre} — ABNORMAL HIGH (ref 0.00–0.15)
Total Protein, Urine: 17 mg/dL

## 2016-11-15 MED ORDER — LABETALOL HCL 200 MG PO TABS
200.0000 mg | ORAL_TABLET | Freq: Every day | ORAL | Status: DC
  Administered 2016-11-15 – 2016-11-16 (×2): 200 mg via ORAL
  Filled 2016-11-15 (×2): qty 2

## 2016-11-15 MED ORDER — SOD CITRATE-CITRIC ACID 500-334 MG/5ML PO SOLN: 30.0000 mL | ORAL | Status: DC | PRN

## 2016-11-15 MED ORDER — LACTATED RINGERS IV SOLN
INTRAVENOUS | Status: DC
Start: 2016-11-15 — End: 2016-11-16
  Administered 2016-11-14 – 2016-11-16 (×3): via INTRAVENOUS

## 2016-11-15 MED ORDER — LIDOCAINE HCL (PF) 1 % IJ SOLN
30.0000 mL | INTRAMUSCULAR | Status: DC | PRN
  Filled 2016-11-15: qty 30

## 2016-11-15 MED ORDER — ONDANSETRON HCL 4 MG/2ML IJ SOLN: 4.0000 mg | Freq: Four times a day (QID) | INTRAMUSCULAR | Status: DC | PRN

## 2016-11-15 MED ORDER — OXYCODONE-ACETAMINOPHEN 5-325 MG PO TABS: 2.0000 | ORAL_TABLET | ORAL | Status: DC | PRN

## 2016-11-15 MED ORDER — ACETAMINOPHEN 325 MG PO TABS
650.0000 mg | ORAL_TABLET | ORAL | Status: DC | PRN
  Filled 2016-11-15: qty 2

## 2016-11-15 MED ORDER — ZOLPIDEM TARTRATE 5 MG PO TABS: 5.0000 mg | ORAL_TABLET | Freq: Every evening | ORAL | Status: DC | PRN

## 2016-11-15 MED ORDER — LACTATED RINGERS IV SOLN: 500.0000 mL | INTRAVENOUS | Status: DC | PRN

## 2016-11-15 MED ORDER — FENTANYL CITRATE (PF) 100 MCG/2ML IJ SOLN: 100.0000 ug | INTRAMUSCULAR | Status: DC | PRN

## 2016-11-15 MED ORDER — OXYTOCIN 40 UNITS IN LACTATED RINGERS INFUSION - SIMPLE MED
2.5000 [IU]/h | INTRAVENOUS | Status: DC
  Administered 2016-11-16: 2.5 [IU]/h via INTRAVENOUS

## 2016-11-15 MED ORDER — OXYTOCIN BOLUS FROM INFUSION
500.0000 mL | Freq: Once | INTRAVENOUS | Status: AC
  Administered 2016-11-16: 500 mL via INTRAVENOUS

## 2016-11-15 MED ORDER — OXYCODONE-ACETAMINOPHEN 5-325 MG PO TABS: 1.0000 | ORAL_TABLET | ORAL | Status: DC | PRN

## 2016-11-15 NOTE — Progress Notes (Signed)
Patient ID: Diamond Patel, female   DOB: [birth year], 31 y.o.   MRN: [id number]  S: [id number] @[redacted]w[redacted]d  pt of [facility] presents with gush of clear odorless fluid at 3 pm today soaking through to her clothes. She has hx of PPROM at 33 weeks with previous pregnancy and delivery at 34 weeks.  She denies regular contractions, vaginal bleeding, h/a, n/v, or fever/chills.  O: BP 143/87 (BP Location: Right Arm)   Pulse 97   Temp 98.8 F (37.1 C) (Oral)   Resp 18   Wt 179 lb (81.2 kg)   LMP 03/19/2016   SpO2 100%   BMI 31.71 kg/m   VS reviewed, nursing note reviewed,  Constitutional: well developed, well nourished, no distress HEENT: normocephalic CV: normal rate Pulm/chest wall: normal effort Abdomen: soft Neuro: alert and oriented x 3 Skin: warm, dry Psych: affect normal  SSE with pooling of clear fluid, cervix visually 3 cm/50% effaced.  FHR tracing Category I  A: 30 y.o. @[redacted]w[redacted]d  PPROM without onset of labor GBS negative Chronic HTN?  P: Admit to BS Expectant management for now Report to Philipp DeputyKim Shaw, CNM  Sharen CounterLisa Leftwich-Kirby, CNM 6:03 PM

## 2016-11-15 NOTE — H&P (Signed)
Diamond Patel is a 31 y.o. female [phone] @ 34.3wks presenting for leaking fluid since 1530. Denies leaking; reports mild ctx since then. Her preg has been followed by the [facility] office and has been remarkable for 1) hx PPROM (33wk) and PTD (34wk)  in 1st preg 2) cHTN (given rx for meds prepreg, but never took them; no meds in preg) 3) GBS neg 4) s/p BMZ x 2 this past week due to preterm dilation to 3cm 5) tx from [facility] @ 34wks. Denies H/A, visual disturbances, or RUQ pain.  OB History    Gravida Para Term Preterm AB Living   3 1 0 1 1 1    SAB TAB Ectopic Multiple Live Births   1       1     Past Medical History:  Diagnosis Date  . Hypertension    History reviewed. No pertinent surgical history. Family History: family history includes Hypertension in her father and mother. Social History:  reports that she has quit smoking. Her smoking use included Cigarettes. She smoked 0.10 packs per day. She has never used smokeless tobacco. She reports that she does not drink alcohol or use drugs.     Maternal Diabetes: No Genetic Screening: Normal Maternal Ultrasounds/Referrals: Normal Fetal Ultrasounds or other Referrals:  None Maternal Substance Abuse:  No Significant Maternal Medications:  None Significant Maternal Lab Results:  Lab values include: Group B Strep negative Other Comments:  None  ROS History   Blood pressure 143/87, pulse 97, temperature 98.8 F (37.1 C), temperature source Oral, resp. rate 18, height 5\' 4"  (1.626 m), weight 81.2 kg (179 lb), last menstrual period 03/19/2016, SpO2 100 %, unknown if currently breastfeeding. Exam Physical Exam  Constitutional: She is oriented to person, place, and time. She appears well-developed.  HENT:  Head: Normocephalic.  Neck: Normal range of motion.  Cardiovascular: Normal rate.   Respiratory: Effort normal.  GI:  EFM 130s, +accels, no decels Irreg, mild ctx  Genitourinary:  Genitourinary Comments: Cx deferred at present, but 3cm  in office  Musculoskeletal: Normal range of motion.  Neurological: She is alert and oriented to person, place, and time.  Skin: Skin is warm and dry.  Psychiatric: She has a normal mood and affect. Her behavior is normal. Thought content normal.    Prenatal labs: ABO, Rh: B/Positive/-- (08/16 0000) Antibody: Negative (08/16 0000) Rubella: Immune (08/16 0000) RPR: Nonreactive (08/16 0000)  HBsAg: Negative (08/16 0000)  HIV: Non-reactive (08/16 0000)  GBS: Negative (01/30 1410)   CBC    Component Value Date/Time   WBC 16.2 (H) 11/15/2016 1825   RBC 4.48 11/15/2016 1825   HGB 13.0 11/15/2016 1825   HCT 37.4 11/15/2016 1825   PLT 183 11/15/2016 1825   MCV 83.5 11/15/2016 1825   MCH 29.0 11/15/2016 1825   MCHC 34.8 11/15/2016 1825   RDW 13.3 11/15/2016 1825   CMP     Component Value Date/Time   NA 134 (L) 11/15/2016 1825   K 3.6 11/15/2016 1825   CL 103 11/15/2016 1825   CO2 21 (L) 11/15/2016 1825   GLUCOSE 89 11/15/2016 1825   BUN 5 (L) 11/15/2016 1825   CREATININE 0.39 (L) 11/15/2016 1825   CALCIUM 9.1 11/15/2016 1825   PROT 6.4 (L) 11/15/2016 1825   ALBUMIN 3.3 (L) 11/15/2016 1825   AST 14 (L) 11/15/2016 1825   ALT 9 (L) 11/15/2016 1825   ALKPHOS 98 11/15/2016 1825   BILITOT 0.3 11/15/2016 1825   GFRNONAA >60 11/15/2016 1825  GFRAA >60 11/15/2016 1825    Assessment/Plan: IUP@34 .3wks cHTN PPROM S/p BMZ GBS neg  -[facility] -Expectant management overnight, although pt asking about the possibility of augmentation tonight -Urine P/C ratio pending (nl CBC, CMET) -Will start Labetalol 200 qd for BP control  Loy Mccartt CNM 11/15/2016, 7:13 PM

## 2016-11-15 NOTE — MAU Note (Signed)
Patient presents to mau with c/o leaking clear fluid since 330pm today. Denies VB at this time. Occasional contractions. +FM

## 2016-11-15 NOTE — MAU Note (Signed)
Patient states on Tuesday she was 3cm dilated

## 2016-11-16 ENCOUNTER — Encounter (HOSPITAL_COMMUNITY): Payer: Self-pay

## 2016-11-16 DIAGNOSIS — O42013 Preterm premature rupture of membranes, onset of labor within 24 hours of rupture, third trimester: Secondary | ICD-10-CM

## 2016-11-16 DIAGNOSIS — Z3A34 34 weeks gestation of pregnancy: Secondary | ICD-10-CM

## 2016-11-16 DIAGNOSIS — O1092 Unspecified pre-existing hypertension complicating childbirth: Secondary | ICD-10-CM

## 2016-11-16 LAB — RPR: RPR: NONREACTIVE

## 2016-11-16 MED ORDER — PRENATAL MULTIVITAMIN CH
1.0000 | ORAL_TABLET | Freq: Every day | ORAL | Status: DC
  Administered 2016-11-17: 1 via ORAL
  Filled 2016-11-16: qty 1

## 2016-11-16 MED ORDER — TETANUS-DIPHTH-ACELL PERTUSSIS 5-2.5-18.5 LF-MCG/0.5 IM SUSP: 0.5000 mL | Freq: Once | INTRAMUSCULAR | Status: DC

## 2016-11-16 MED ORDER — BENZOCAINE-MENTHOL 20-0.5 % EX AERO
1.0000 "application " | INHALATION_SPRAY | CUTANEOUS | Status: DC | PRN
  Filled 2016-11-16: qty 56

## 2016-11-16 MED ORDER — DIBUCAINE 1 % RE OINT: 1.0000 "application " | TOPICAL_OINTMENT | RECTAL | Status: DC | PRN

## 2016-11-16 MED ORDER — SIMETHICONE 80 MG PO CHEW: 80.0000 mg | CHEWABLE_TABLET | ORAL | Status: DC | PRN

## 2016-11-16 MED ORDER — ONDANSETRON HCL 4 MG/2ML IJ SOLN: 4.0000 mg | INTRAMUSCULAR | Status: DC | PRN

## 2016-11-16 MED ORDER — COCONUT OIL OIL: 1.0000 "application " | TOPICAL_OIL | Status: DC | PRN

## 2016-11-16 MED ORDER — ACETAMINOPHEN 325 MG PO TABS: 650.0000 mg | ORAL_TABLET | ORAL | Status: DC | PRN

## 2016-11-16 MED ORDER — LABETALOL HCL 5 MG/ML IV SOLN
INTRAVENOUS | Status: AC
  Filled 2016-11-16: qty 12

## 2016-11-16 MED ORDER — ACETAMINOPHEN 160 MG/5ML PO SOLN
650.0000 mg | ORAL | Status: DC | PRN
Start: 2016-11-16 — End: 2016-11-16
  Administered 2016-11-16: 650 mg via ORAL
  Filled 2016-11-16: qty 20.3

## 2016-11-16 MED ORDER — OXYTOCIN 40 UNITS IN LACTATED RINGERS INFUSION - SIMPLE MED
1.0000 m[IU]/min | INTRAVENOUS | Status: DC
  Administered 2016-11-16: 2 m[IU]/min via INTRAVENOUS
  Filled 2016-11-16: qty 1000

## 2016-11-16 MED ORDER — ZOLPIDEM TARTRATE 5 MG PO TABS: 5.0000 mg | ORAL_TABLET | Freq: Every evening | ORAL | Status: DC | PRN

## 2016-11-16 MED ORDER — TERBUTALINE SULFATE 1 MG/ML IJ SOLN: 0.2500 mg | Freq: Once | INTRAMUSCULAR | Status: DC | PRN

## 2016-11-16 MED ORDER — HYDRALAZINE HCL 20 MG/ML IJ SOLN: 10.0000 mg | Freq: Once | INTRAMUSCULAR | Status: DC | PRN

## 2016-11-16 MED ORDER — MAGNESIUM SULFATE BOLUS VIA INFUSION
4.0000 g | Freq: Once | INTRAVENOUS | Status: AC
  Administered 2016-11-16: 4 g via INTRAVENOUS
  Filled 2016-11-16: qty 500

## 2016-11-16 MED ORDER — LACTATED RINGERS IV SOLN
INTRAVENOUS | Status: DC
  Administered 2016-11-16 – 2016-11-17 (×4): via INTRAVENOUS

## 2016-11-16 MED ORDER — IBUPROFEN 600 MG PO TABS
600.0000 mg | ORAL_TABLET | Freq: Four times a day (QID) | ORAL | Status: DC
  Administered 2016-11-16 – 2016-11-18 (×6): 600 mg via ORAL
  Filled 2016-11-16 (×6): qty 1

## 2016-11-16 MED ORDER — SENNOSIDES-DOCUSATE SODIUM 8.6-50 MG PO TABS
2.0000 | ORAL_TABLET | ORAL | Status: DC
  Administered 2016-11-17 (×2): 2 via ORAL
  Filled 2016-11-16 (×2): qty 2

## 2016-11-16 MED ORDER — ONDANSETRON HCL 4 MG PO TABS: 4.0000 mg | ORAL_TABLET | ORAL | Status: DC | PRN

## 2016-11-16 MED ORDER — DIPHENHYDRAMINE HCL 25 MG PO CAPS: 25.0000 mg | ORAL_CAPSULE | Freq: Four times a day (QID) | ORAL | Status: DC | PRN

## 2016-11-16 MED ORDER — WITCH HAZEL-GLYCERIN EX PADS: 1.0000 "application " | MEDICATED_PAD | CUTANEOUS | Status: DC | PRN

## 2016-11-16 MED ORDER — AMLODIPINE BESYLATE 5 MG PO TABS
5.0000 mg | ORAL_TABLET | Freq: Every day | ORAL | Status: DC
  Administered 2016-11-17 – 2016-11-18 (×2): 5 mg via ORAL
  Filled 2016-11-16 (×3): qty 1

## 2016-11-16 MED ORDER — LABETALOL HCL 5 MG/ML IV SOLN
20.0000 mg | INTRAVENOUS | Status: DC | PRN
  Administered 2016-11-16: 20 mg via INTRAVENOUS
  Administered 2016-11-16: 40 mg via INTRAVENOUS

## 2016-11-16 MED ORDER — MAGNESIUM SULFATE 50 % IJ SOLN
2.0000 g/h | INTRAVENOUS | Status: AC
  Administered 2016-11-16: 2 g/h via INTRAVENOUS
  Filled 2016-11-16 (×2): qty 80

## 2016-11-16 NOTE — Progress Notes (Signed)
[facility], stopped by NICU to see baby.

## 2016-11-16 NOTE — Progress Notes (Signed)
Discussed plan of care with Cam HaiKimberly Shaw, CNM. Patient may eat breakfast and shower, then pitocin will be started. Provider to provider communication will occur this morning and NICU consult can be ordered. Patient and support person had no questions at this time.

## 2016-11-16 NOTE — Anesthesia Pain Management Evaluation Note (Signed)
  CRNA Pain Management Visit Note  Patient: Diamond JarvisElicia Patel, 31 y.o., female  "Hello I am a [facility] of the [facility]. We have an anesthesia team available at all times to provide care throughout the hospital, including epidural management and anesthesia for C-section. I don't know your plan for the delivery whether it a natural birth, water birth, IV sedation, nitrous supplementation, doula or epidural, but we want to meet your pain goals."   1.Was your pain managed to your expectations on prior hospitalizations?   Yes   2.What is your expectation for pain management during this hospitalization?     Labor support without medications and IV pain meds  3.How can we help you reach that goal? Pt has had previous delivery with no epidural and does not want one this time. She was not willing to discuss any concerns or questions. Assured her we ere available if needed.  Record the patient's initial score and the patient's pain goal.   Pain: 0  Pain Goal: 10 [facility] wants you to be able to say your pain was always managed very well.  Chancey Cullinane 11/16/2016

## 2016-11-16 NOTE — Progress Notes (Signed)
S: Patient seen & examined for progress of labor. Patient comfortable. Reports a decrease in frequency of contractions.   O:  Vitals:   11/15/16 2230 11/15/16 2316 11/15/16 2350 11/16/16 0030  BP:  (!) 145/79 (!) 148/71   Pulse:  94 93   Resp:  16    Temp: 98.6 F (37 C)   98.6 F (37 C)  TempSrc: Oral   Oral  SpO2:      Weight:      Height:            FHT: 130s bpm, mod var, +accels, no decels TOCO: irregular contraction pattern   A/P: Continue expectant management Consider augmentation in the morning if patient without progression Anticipate SVD   Gorden HarmsMegan Arlett Goold, MD PGY-2 11/16/2016 3:40 AM

## 2016-11-16 NOTE — Progress Notes (Signed)
Patient and visitors educated about the risks of high blood pressure/preeclampsia while pregnant and in the postpartum period. Patient's blood pressure in the severe range; I explained the labetaolol protocol to the patient and visitors. Dr. Genevie AnnSchenk in to discuss the use of magnesium to prevent seizures. Patient and visitors became angry over the situation. Preeclampsia precautions re-explained. All questions answered.

## 2016-11-16 NOTE — Progress Notes (Signed)
   11/16/16 1533 11/16/16 1545 11/16/16 1552  Vital Signs  BP (!) 164/94 (!) 168/90 (20mg  IV labetalol given )  Pulse Rate 94 89 --   Resp 20 20 --      11/16/16 1600 11/16/16 1602 11/16/16 1605  Vital Signs  BP (!) 166/97 (!) 166/91 (40mg  IV labetalol given)  Pulse Rate 91 90 --   Resp 20 18 --      11/16/16 1615  Vital Signs  BP (!) 158/88  Pulse Rate 87  Resp 18

## 2016-11-16 NOTE — Consult Note (Signed)
Neonatology Note:   Attendance at Delivery:    I was asked by Dr. Schenk to attend this NSVD at 34 4/7 weeks due to SROM and onset of preterm labor. The mother is a G3P1A1 B pos, GBS neg with chronic HTN, on no meds. She got 2 doses of Betamethasone in the past week due to cervical changes and history of preterm delivery. She has gotten Labetalol for HTN while in hospital, labor augmented with Pitocin. She did not get any antibiotics during labor, and she remained afebrile. ROM 24 hours prior to delivery, fluid clear. Infant vigorous with good spontaneous cry and tone. Delayed cord clamping was done. Needed only minimal bulb suctioning. Ap 9/9. Lungs clear to ausc in DR. Held briefly by his parents, then transported to the NICU for further care, with his father in attendance.   Amron Guerrette C. Gurkirat Basher, MD 

## 2016-11-16 NOTE — Progress Notes (Signed)
Patient seen and going well. No complaints. Bp have been elevated, but no severe range today. Will continue monitor. Patietn is 3/90/-3. Will start pitocin at this time. Patient does not plan for any pain medications.

## 2016-11-16 NOTE — Progress Notes (Signed)
Patient seen. Has been doing well after delivery. No complaints, but last 3 BP have been >160. Patient with a history of hypertension has been on labetolol. Will start 5mg  norvasc, labetolol protocol and magesium with a 4g bolus and 2g per hour.

## 2016-11-17 LAB — TOXASSURE SELECT 13 (MW), URINE

## 2016-11-17 NOTE — Progress Notes (Signed)
As advised by staff, patient sought company to visit  Baby in NICU.  But patient refused to ride wheelchair as offered by staff and informed that since she is on magnesium drip it is advisable to use wheelchair for safety reasons.  Patient strongly refused wheelchair ride.

## 2016-11-17 NOTE — Progress Notes (Signed)
Patient went to NICU with significant other at 2000 and was advised to inform nurses if she has to leave the unit since she is on Magnesium drip, she needs to be accompanied by staff. Again at 0000 patient not in room and staff was not informed.

## 2016-11-17 NOTE — Lactation Note (Signed)
This note was copied from a baby's chart. Lactation Consultation Note  Patient Name: [name] ZHYQM'VToday's Date: 11/17/2016 Reason for consult: Follow-up assessment;NICU baby;Infant < 6lbs;Late preterm infant   Follow up consult with mom in NICU. Infant went to breast yesterday but had some difficulty with Bradycardia and desaturations.   Discussed with mom positioning, and latch and what to expect with late preterm infant. Discussed with mom limiting BF to 15 minutes so infant can take supplement. Mom reports she wants to put infant to breast to stimulate milk production, praised her for her efforts.   Assisted mom in latching infant to right breast in the cross cradle hold. Infant nuzzled at the breast for a few licks and then fell asleep. Discussed with mom that this is normal BF behavior for LPT infant. Enc mom to practicee nuzzling and STS as infant as able to tolerate. Enc mom to call for assistance as needed.      Maternal Data Formula Feeding for Exclusion: No Has patient been taught Hand Expression?: Yes Does the patient have breastfeeding experience prior to this delivery?: Yes  Feeding Feeding Type: Breast Fed Length of feed: 0 min  LATCH Score/Interventions Latch: Too sleepy or reluctant, no latch achieved, no sucking elicited. Intervention(s): Skin to skin;Teach feeding cues;Waking techniques  Audible Swallowing: None  Type of Nipple: Everted at rest and after stimulation  Comfort (Breast/Nipple): Soft / non-tender     Hold (Positioning): Assistance needed to correctly position infant at breast and maintain latch. Intervention(s): Breastfeeding basics reviewed;Support Pillows;Position options;Skin to skin  LATCH Score: 5  Lactation Tools Discussed/Used WIC Program: No Pump Review: Setup, frequency, and cleaning;Milk Storage Initiated by:: Reviewed by Max FickleS Kirsten Spearing, RN, IBCLC   Consult Status Consult Status: Follow-up Date: 11/18/16 Follow-up type:  In-patient    Diamond Patel 11/17/2016, 3:33 PM

## 2016-11-17 NOTE — Lactation Note (Signed)
This note was copied from a baby's chart. Lactation Consultation Note  Patient Name: Diamond Patel ZOXWR'UToday's Date: 11/17/2016 Reason for consult: Initial assessment;NICU baby;Infant < 6lbs;Late preterm infant   Initial consult with mom of 3922 hour old NICU infant. Mom reports infant is doing well and feedign, mom reports she has been able to hold infant STS.   Mom pumping about every hour and getting small volumes of colostrum. She is concerned she is not getting much. Reviewed supply and demand and milk coming to volume. Enc mom to pump every 2-3 hours for 15 minutes in Initate setting using DEBP. Enc mom to follow pumping with hand expression. Mom reports she has been shown how to hand express. Mom is wanting feeding assistance in NICU at 3 pm today.   Gave mom Providing Milk for Your Baby in NICU Booklet and reviewed pumping and what to expect and breast milk labeling, storage and handling for NICU infant. BF Resources Handout and [facility] Brochure reviewed, mom informed of IP/OP Services, BF Support Groups and LC phone #. Enc mom to call for assistance as needed.   Mom is planning to order a DEBP through her insurance company and plans to rent a [employer] for 2 weeks. Mom without further questions/concerns.    Maternal Data Formula Feeding for Exclusion: No Has patient been taught Hand Expression?: Yes Does the patient have breastfeeding experience prior to this delivery?: Yes  Feeding    LATCH Score/Interventions                      Lactation Tools Discussed/Used WIC Program: No Pump Review: Setup, frequency, and cleaning;Milk Storage Initiated by:: Reviewed by Max FickleS Abubakar Crispo, RN, IBCLC   Consult Status Consult Status: Follow-up Date: 11/18/16 Follow-up type: In-patient    Silas FloodSharon S Tifanie Gardiner 11/17/2016, 1:24 PM

## 2016-11-17 NOTE — Progress Notes (Signed)
Patient informed nurse she was leaving the floor for the NICU with the accompaniment of her parents. Patient informed by nurse that she needed a member of the nursing staff with her if she leaves the floor while on magnesium. Nurse escorted patient to NICU. Patient was visibly upset about having to return to the third floor and verbally expressed displeasure about the magnesium policy.

## 2016-11-18 MED ORDER — IBUPROFEN 600 MG PO TABS: 600.0000 mg | ORAL_TABLET | Freq: Four times a day (QID) | ORAL | 0 refills | Status: DC | PRN

## 2016-11-18 MED ORDER — AMLODIPINE BESYLATE 5 MG PO TABS: 5.0000 mg | ORAL_TABLET | Freq: Every day | ORAL | 0 refills | Status: DC

## 2016-11-18 MED ORDER — DOCUSATE SODIUM 100 MG PO CAPS: 100.0000 mg | ORAL_CAPSULE | Freq: Two times a day (BID) | ORAL | 0 refills | Status: DC

## 2016-11-18 NOTE — Progress Notes (Signed)
Discharge teaching complete. Pt understood all information and did not have any questions. Pt ambulated out of the hospital and discharged home to family,

## 2016-11-18 NOTE — Lactation Note (Signed)
This note was copied from a baby's chart. Lactation Consultation Note  Patient Name: [name] RUEAV'WToday's Date: 11/18/2016  2 week pump rental completed.  Mom is pumping 10-20 mls of transitional milk and pleased volume is increasing.   Maternal Data    Feeding Feeding Type: Formula Nipple Type: Slow - flow Length of feed: 5 min  LATCH Score/Interventions                      Lactation Tools Discussed/Used     Consult Status      Huston FoleyMOULDEN, [name] 11/18/2016, 11:10 AM

## 2016-11-18 NOTE — Progress Notes (Signed)
CSW acknowledges NICU admission.    Patient screened out for psychosocial assessment since none of the following apply:  Psychosocial stressors documented in mother or baby's chart  Gestation less than 32 weeks  Code at delivery   Infant with anomalies  Please contact the Clinical Social Worker if specific needs arise, or by MOB's request.       

## 2016-11-18 NOTE — Discharge Summary (Signed)
OB Discharge Summary     Patient Name: Diamond Patel DOB: 02/20/1986 MRN: [id number]  Date of admission: 11/15/2016 Delivering MD: Ernestina PennaSCHENK, NICHOLAS MICHAEL   Date of discharge: 11/18/2016  Admitting diagnosis: 34WKS WATER BROKEN Intrauterine pregnancy: 4092w4d     Secondary diagnosis:  Active Problems:   Preterm premature rupture of membranes   Chronic hypertension affecting pregnancy  Additional problems: 34wk IUP, cHTN (no meds outpatient), PPROM     Discharge diagnosis: Preterm Pregnancy Delivered                                                                                                Post partum procedures:none  Augmentation: Pitocin  Complications: None  Hospital course:  Onset of Labor With Vaginal Delivery     31 y.o. yo [id number] at 4192w4d was admitted in Latent Labor on 11/15/2016. Patient had an uncomplicated labor course as follows:  Membrane Rupture Time/Date: 3:30 AM ,11/15/2016   Intrapartum Procedures: Episiotomy: None [1]                                         Lacerations:  Periurethral [8]  Patient had a delivery of a Viable infant. 11/16/2016  Information for the patient's newborn:  Jonna Coupope, Boy Ingri [[id number]][[id number]]       Pateint had an uncomplicated postpartum course.  She is ambulating, tolerating a regular diet, passing flatus, and urinating well. Patient is discharged home in stable condition on 11/18/16.   Physical exam  Vitals:   11/17/16 2313 11/18/16 0424 11/18/16 0735 11/18/16 1015  BP: (!) 136/59 (!) 152/78 (!) 152/83 (!) 151/81  Pulse: 95 85 82   Resp: 18 18 18    Temp: 98.1 F (36.7 C) 98.3 F (36.8 C) 98.1 F (36.7 C)   TempSrc: Oral Oral Oral   SpO2: 100% 100% 100%   Weight:      Height:       General: alert, cooperative and no distress Lochia: appropriate Uterine Fundus: firm Incision: N/A DVT Evaluation: No evidence of DVT seen on physical exam. Labs: Lab Results  Component Value Date   WBC 16.2 (H) 11/15/2016   HGB 13.0 11/15/2016    HCT 37.4 11/15/2016   MCV 83.5 11/15/2016   PLT 183 11/15/2016   CMP Latest Ref Rng & Units 11/15/2016  Glucose 65 - 99 mg/dL 89  BUN 6 - 20 mg/dL 5(L)  Creatinine 2.440.44 - 1.00 mg/dL 0.10(U0.39(L)  Sodium 725135 - 366145 mmol/L 134(L)  Potassium 3.5 - 5.1 mmol/L 3.6  Chloride 101 - 111 mmol/L 103  CO2 22 - 32 mmol/L 21(L)  Calcium 8.9 - 10.3 mg/dL 9.1  Total Protein 6.5 - 8.1 g/dL 6.4(L)  Total Bilirubin 0.3 - 1.2 mg/dL 0.3  Alkaline Phos 38 - 126 U/L 98  AST 15 - 41 U/L 14(L)  ALT 14 - 54 U/L 9(L)    Discharge instruction: per After Visit Summary and "Baby and Me Booklet".  After visit meds:  Allergies as of 11/18/2016   No  Known Allergies     Medication List    STOP taking these medications   acetaminophen 325 MG tablet Commonly known as:  TYLENOL   prenatal multivitamin Tabs tablet     TAKE these medications   amLODipine 5 MG tablet Commonly known as:  NORVASC Take 1 tablet (5 mg total) by mouth daily.   docusate sodium 100 MG capsule Commonly known as:  COLACE Take 1 capsule (100 mg total) by mouth 2 (two) times daily.   ibuprofen 600 MG tablet Commonly known as:  ADVIL,MOTRIN Take 1 tablet (600 mg total) by mouth every 6 (six) hours as needed for mild pain or moderate pain.       Diet: routine diet  Activity: Advance as tolerated. Pelvic rest for 6 weeks.   Outpatient follow up: Encouraged patient to make follow up appointment with OB for BP check in 1-2 weeks. Also set up baby love visits for BP checks on day 3 and 7.  Follow up Appt:No future appointments. Follow up Visit:No Follow-up on file.  Postpartum contraception: Undecided  Newborn Data: Live born female  Birth Weight: 4 lb 4.4 oz (1940 g) APGAR: 9, 9  Baby Feeding: Breast Disposition:home with mother   11/18/2016 Renne Musca, MD   Midwife attestation I have seen and examined this patient and agree with above documentation in the resident's note.   Diamond Patel is a 31 y.o. [id number] s/p PPROM  and preterm SVD.   Pain is well controlled.  Plan for birth control is IUD.  Method of Feeding: breast  PE:  BP (!) 151/81 Comment: bp med due  Pulse 82   Temp 98.1 F (36.7 C) (Oral)   Resp 18   Ht 5\' 4"  (1.626 m)   Wt 179 lb (81.2 kg)   LMP 03/19/2016   SpO2 100%   Breastfeeding? Unknown   BMI 30.73 kg/m  Gen: well appearing Heart: reg rate Lungs: normal WOB Fundus firm Ext: soft, no pain, no edema   Recent Labs  11/15/16 1825  HGB 13.0  HCT 37.4     Plan: discharge today - postpartum care discussed - f/u clinic in 1 week for postpartum visit/BP check --D/C on Norvasc 5 mg daily.   --Recommend Tylenol and limited use of ibuprofen for PP pain.     Sharen Counter, CNM 11:17 AM

## 2016-11-20 ENCOUNTER — Encounter: Payer: BLUE CROSS/BLUE SHIELD | Admitting: Obstetrics & Gynecology

## 2016-11-25 ENCOUNTER — Ambulatory Visit: Payer: Self-pay

## 2016-11-25 NOTE — Lactation Note (Signed)
This note was copied from a baby's chart. Lactation Consultation Note  Patient Name: [name] XBMWU'XToday's Date: 11/25/2016 Reason for consult: Follow-up assessment  With this mom of a NICU baby, now 589 days old, and 35 6/7 weeks CGA. Mom was concerned that her milk supply started out good, and was now decreasing. Mom had been pumping only 3-4 times a day. I reviewed supply and demand with mom, and encouraged her to pump every 2 to 3- hours, 8-12 times a day, and to pump until she stops dripipng, and add hand expression, to increase her supply. Mom knows to call for questions/concerns.    Maternal Data    Feeding Feeding Type: Formula Length of feed: 30 min  LATCH Score/Interventions                      Lactation Tools Discussed/Used     Consult Status Consult Status: PRN Follow-up type: In-patient (NICU)    Alfred LevinsLee, Lasaro Primm Anne 11/25/2016, 12:30 PM

## 2017-02-03 ENCOUNTER — Encounter: Payer: Self-pay | Admitting: *Deleted

## 2017-02-17 ENCOUNTER — Ambulatory Visit (INDEPENDENT_AMBULATORY_CARE_PROVIDER_SITE_OTHER): Payer: BLUE CROSS/BLUE SHIELD | Admitting: Obstetrics and Gynecology

## 2017-02-17 ENCOUNTER — Encounter: Payer: Self-pay | Admitting: Obstetrics and Gynecology

## 2017-02-17 VITALS — BP 172/96 | HR 105 | Temp 99.0°F | Ht 64.0 in | Wt 157.8 lb

## 2017-02-17 DIAGNOSIS — Z01419 Encounter for gynecological examination (general) (routine) without abnormal findings: Secondary | ICD-10-CM | POA: Diagnosis not present

## 2017-02-17 DIAGNOSIS — Z113 Encounter for screening for infections with a predominantly sexual mode of transmission: Secondary | ICD-10-CM | POA: Diagnosis not present

## 2017-02-17 MED ORDER — AMLODIPINE BESYLATE 5 MG PO TABS: 5.0000 mg | ORAL_TABLET | Freq: Every day | ORAL | 0 refills | Status: DC

## 2017-02-17 NOTE — Progress Notes (Signed)
Patient presents for Annual Exam/PAP and cultures. She complains of frequent headaches. She had a baby 11/16/16 but did not come in for her PP check up. Her Bp is elevated, she is not taking her BP meds.

## 2017-02-17 NOTE — Progress Notes (Signed)
Subjective:     Diamond Patel is a 31 y.o. female [id number] with BMI 27 who is here for a comprehensive physical exam. The patient reports no problems. She is sexually active without contraception and is interested in Mirena IUD. She reports 2 normal period since her delivery on 11/16/2016 which have been regular lasting 4 days. She denies any pelvic pain or abnormal discharge. She reports suffering from migraine headaches which are relieved with Aleeve. She also admits to being under a lot of stress and feeling anxious.  Past Medical History:  Diagnosis Date  . Hypertension    No past surgical history on file. Family History  Problem Relation Age of Onset  . Hypertension Father   . Hypertension Mother     Social History   Social History  . Marital status: Single    Spouse name: N/A  . Number of children: N/A  . Years of education: N/A   Occupational History  . Not on file.   Social History Main Topics  . Smoking status: Former Smoker    Packs/day: 0.10    Types: Cigarettes  . Smokeless tobacco: Never Used  . Alcohol use Yes     Comment: occasionally  . Drug use: No  . Sexual activity: Yes   Other Topics Concern  . Not on file   Social History Narrative  . No narrative on file   Health Maintenance  Topic Date Due  . TETANUS/TDAP  12/23/2004  . PAP SMEAR  12/24/2006  . INFLUENZA VACCINE  05/14/2017  . HIV Screening  Completed       Review of Systems Pertinent items are noted in HPI.   Objective:  Blood pressure (!) 172/96, pulse (!) 105, temperature 99 F (37.2 C), temperature source Oral, height 5\' 4"  (1.626 m), weight 157 lb 12.8 oz (71.6 kg), last menstrual period 02/16/2017, not currently breastfeeding.  GENERAL: Well-developed, well-nourished female in no acute distress.  HEENT: Normocephalic, atraumatic. Sclerae anicteric.  NECK: Supple. Normal thyroid.  LUNGS: Clear to auscultation bilaterally.  HEART: Regular rate and rhythm. BREASTS: Symmetric in  size. No palpable masses or lymphadenopathy, skin changes, or nipple drainage. ABDOMEN: Soft, nontender, nondistended. Marland Kitchen. PELVIC: Normal external female genitalia. Vagina is pink and rugated.  Normal discharge. Normal appearing cervix. Uterus is normal in size. No adnexal mass or tenderness. EXTREMITIES: No cyanosis, clubbing, or edema, 2+ distal pulses.       Assessment:    Healthy female exam.      Plan:    pap smear collected  Patient has been sexually active within the past week. Patient will return in 2 weeks for IUD insertion Patient with elevated GAD score. She declined referral to be seen by Asher MuirJamie- behavioral health specialist. She states she will follow up with PCP for further management Patient also with elevated BP. She discontinue Norvasc after delivery and reports changing her diet and exercising more. Informed patient that lifestyle changes are not always sufficient and medication is required.  Advised patient to restart Norvasc and follow up with PCP Patient will be contacted with abnormal results See After Visit Summary for Counseling Recommendations

## 2017-02-19 LAB — CYTOLOGY - PAP
DIAGNOSIS: NEGATIVE
HPV: NOT DETECTED

## 2017-02-19 LAB — CERVICOVAGINAL ANCILLARY ONLY
Bacterial vaginitis: NEGATIVE
Candida vaginitis: POSITIVE — AB
Chlamydia: NEGATIVE
Neisseria Gonorrhea: NEGATIVE
Trichomonas: NEGATIVE

## 2017-02-20 ENCOUNTER — Other Ambulatory Visit: Payer: Self-pay | Admitting: Obstetrics and Gynecology

## 2017-02-20 MED ORDER — FLUCONAZOLE 150 MG PO TABS: 150.0000 mg | ORAL_TABLET | Freq: Once | ORAL | 0 refills | Status: AC

## 2017-02-24 ENCOUNTER — Telehealth: Payer: Self-pay | Admitting: *Deleted

## 2017-02-24 NOTE — Telephone Encounter (Signed)
Pt made aware of lab results and provider has sent Rx to pharmacy.

## 2017-02-24 NOTE — Telephone Encounter (Signed)
-----   Message from Catalina AntiguaPeggy Constant, MD sent at 02/20/2017 11:35 AM EDT ----- Please inform patient of yeast infection. Rx e-prescribed  Thanks  Kinder Morgan EnergyPeggy

## 2017-04-29 ENCOUNTER — Encounter (HOSPITAL_COMMUNITY): Payer: Self-pay

## 2017-04-29 ENCOUNTER — Emergency Department (HOSPITAL_COMMUNITY)
Admission: EM | Admit: 2017-04-29 | Discharge: 2017-04-29 | Discharge status: Against Medical Advice | Payer: BLUE CROSS/BLUE SHIELD | Attending: Emergency Medicine | Admitting: Emergency Medicine

## 2017-04-29 DIAGNOSIS — R197 Diarrhea, unspecified: Secondary | ICD-10-CM | POA: Diagnosis present

## 2017-04-29 DIAGNOSIS — Z5321 Procedure and treatment not carried out due to patient leaving prior to being seen by health care provider: Secondary | ICD-10-CM | POA: Insufficient documentation

## 2017-04-29 LAB — COMPREHENSIVE METABOLIC PANEL
ALBUMIN: 3.4 g/dL — AB (ref 3.5–5.0)
ALK PHOS: 81 U/L (ref 38–126)
ALT: 14 U/L (ref 14–54)
AST: 28 U/L (ref 15–41)
Anion gap: 8 (ref 5–15)
BUN: 7 mg/dL (ref 6–20)
CALCIUM: 9.5 mg/dL (ref 8.9–10.3)
CO2: 21 mmol/L — AB (ref 22–32)
CREATININE: 0.34 mg/dL — AB (ref 0.44–1.00)
Chloride: 107 mmol/L (ref 101–111)
Glucose, Bld: 118 mg/dL — ABNORMAL HIGH (ref 65–99)
Potassium: 3.4 mmol/L — ABNORMAL LOW (ref 3.5–5.1)
Sodium: 136 mmol/L (ref 135–145)
Total Bilirubin: 0.8 mg/dL (ref 0.3–1.2)
Total Protein: 5.8 g/dL — ABNORMAL LOW (ref 6.5–8.1)

## 2017-04-29 LAB — CBC WITH DIFFERENTIAL/PLATELET
BASOS ABS: 0 10*3/uL (ref 0.0–0.1)
BASOS PCT: 0 %
EOS ABS: 0.1 10*3/uL (ref 0.0–0.7)
EOS PCT: 1 %
HEMATOCRIT: 36.1 % (ref 36.0–46.0)
Hemoglobin: 12.1 g/dL (ref 12.0–15.0)
Lymphocytes Relative: 40 %
Lymphs Abs: 2.8 10*3/uL (ref 0.7–4.0)
MCH: 26 pg (ref 26.0–34.0)
MCHC: 33.5 g/dL (ref 30.0–36.0)
MCV: 77.6 fL — ABNORMAL LOW (ref 78.0–100.0)
MONO ABS: 0.7 10*3/uL (ref 0.1–1.0)
MONOS PCT: 10 %
Neutro Abs: 3.4 10*3/uL (ref 1.7–7.7)
Neutrophils Relative %: 49 %
PLATELETS: 186 10*3/uL (ref 150–400)
RBC: 4.65 MIL/uL (ref 3.87–5.11)
RDW: 14.1 % (ref 11.5–15.5)
WBC: 6.9 10*3/uL (ref 4.0–10.5)

## 2017-04-29 LAB — I-STAT BETA HCG BLOOD, ED (MC, WL, AP ONLY): I-stat hCG, quantitative: <5 m[IU]/mL (ref <5)

## 2017-04-29 NOTE — ED Triage Notes (Signed)
Pt reports diarrhea that has been going on for two weeks. She states she is unable to control it and has lost weight as well. She also reports headache. Recent gave birth on Feb 3rd. Ambulatory, NAD.

## 2017-04-29 NOTE — ED Notes (Signed)
Pt stated she has been waiting a while and needed to leave. Pt encouraged to stay, stated she may return later.

## 2017-06-09 IMAGING — US US OB COMP LESS 14 WK
1 series · 15 of 28 positions shown · non-contrast
Comparison: Pelvic ultrasound 12/26/2014

CLINICAL DATA: Vaginal bleeding. Gestational age by LMP is 9 weeks
and 5 days.

EXAM:
OBSTETRIC <14 WK US AND TRANSVAGINAL OB US
TECHNIQUE: Both transabdominal and transvaginal ultrasound examinations were
performed for complete evaluation of the gestation as well as the
maternal uterus, adnexal regions, and pelvic cul-de-sac.
Transvaginal technique was performed to assess early pregnancy.

[Series 1: us ob comp less 14 wk · 15 of 52 slices shown]
[im 1/52]
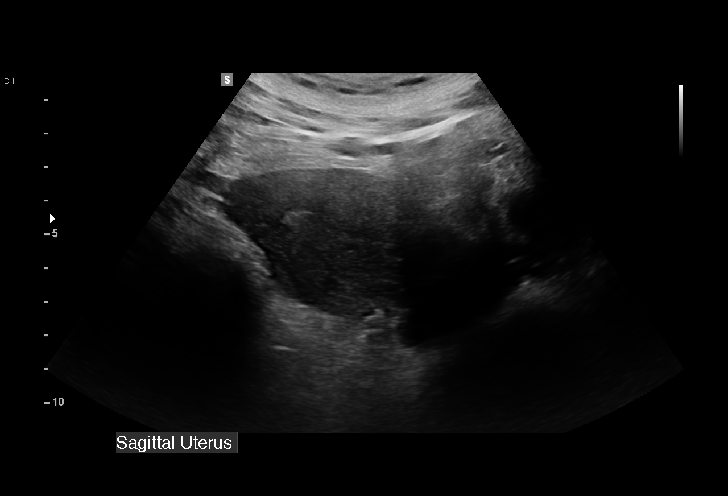
[im 4/52]
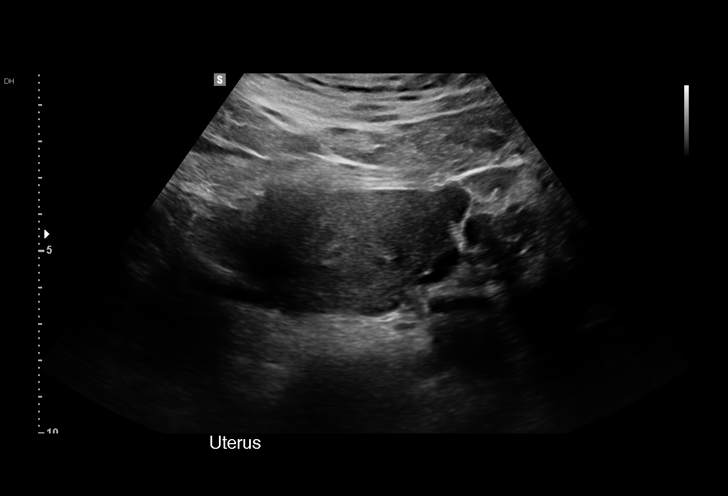
[im 8/52]
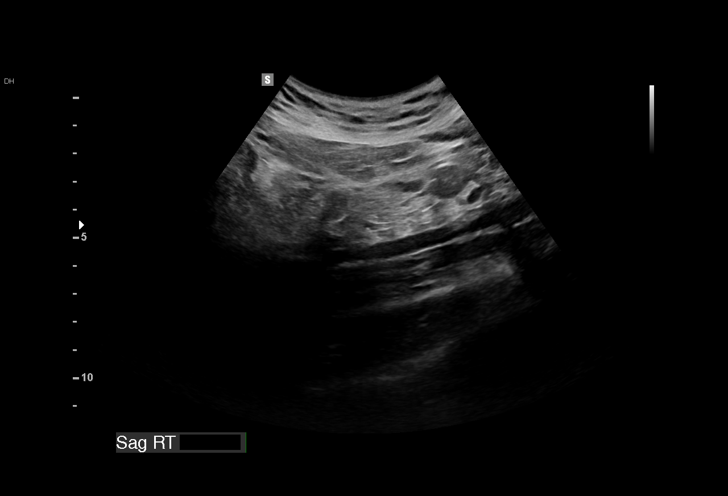
[im 12/52]
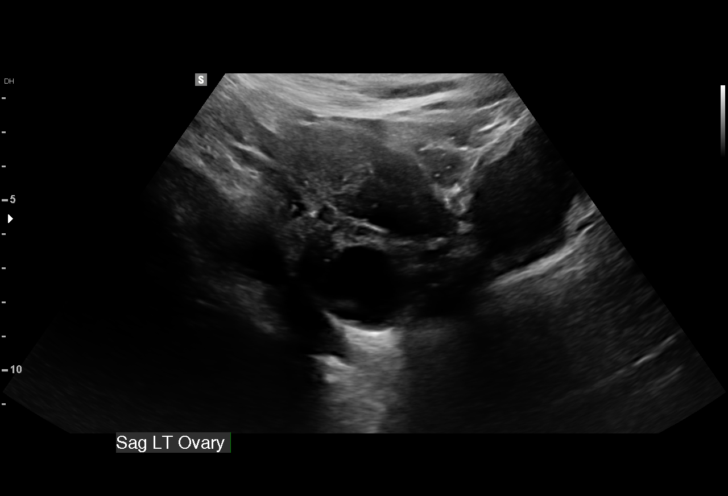
[im 16/52]
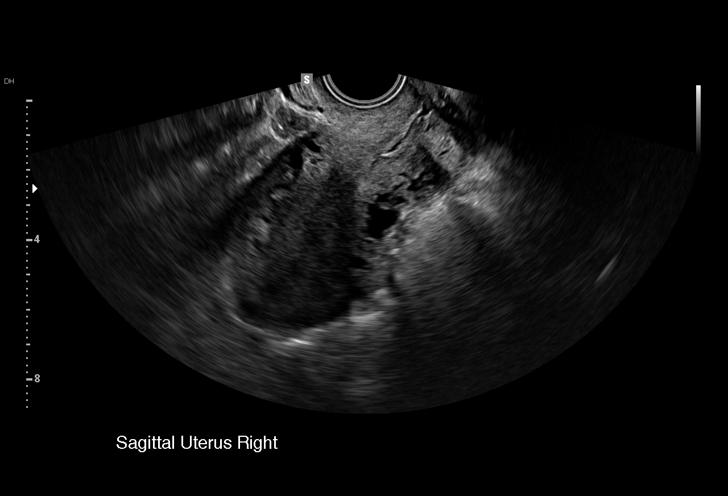
[im 19/52]
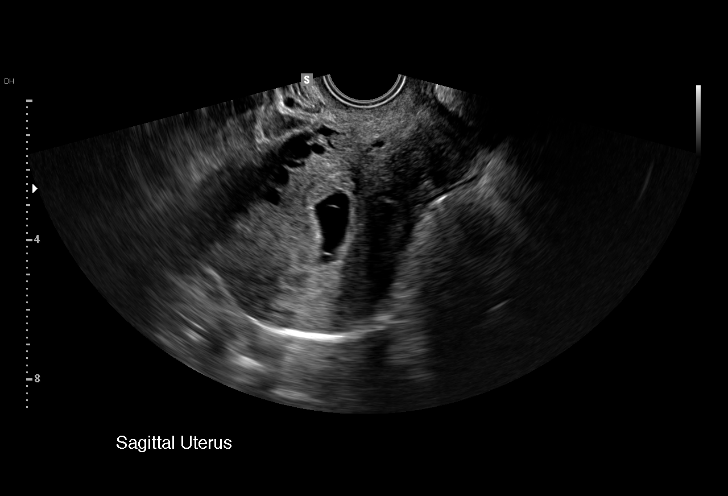
[im 23/52]
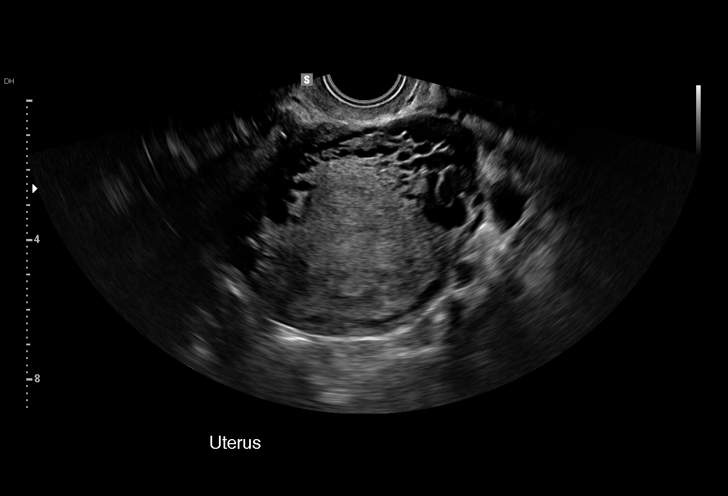
[im 27/52]
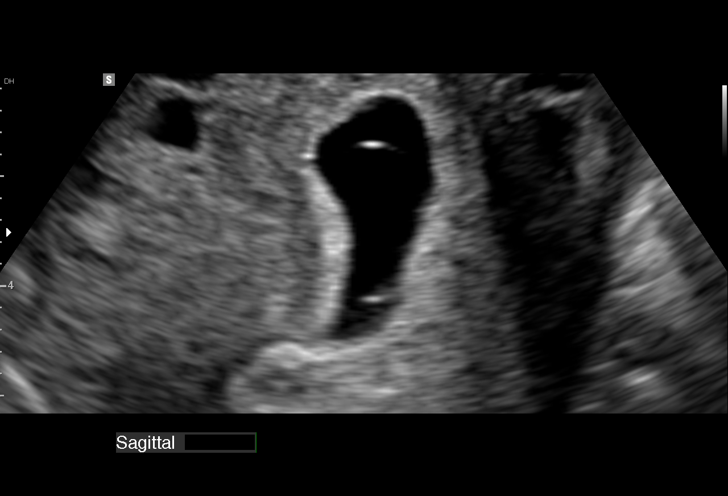
[im 29/52]
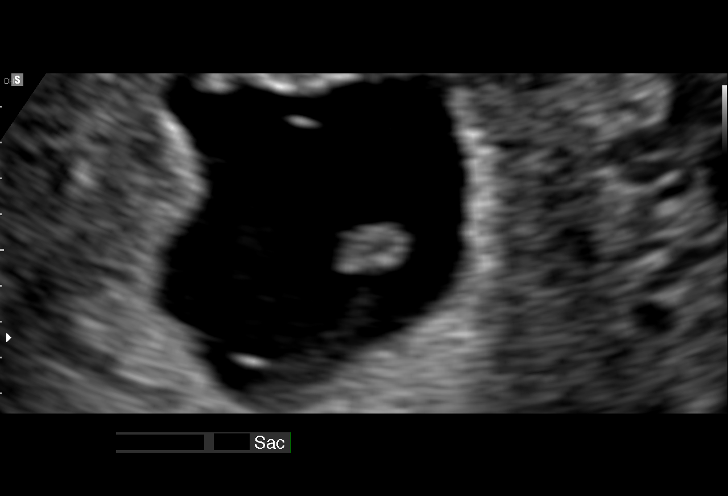
[im 33/52]
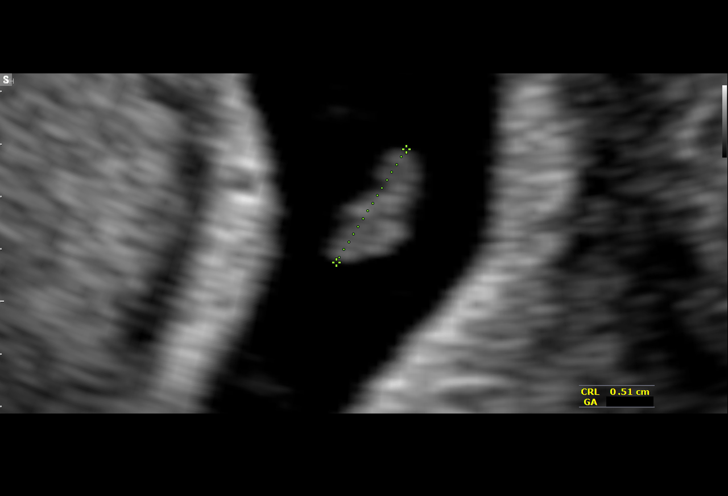
[im 36/52]
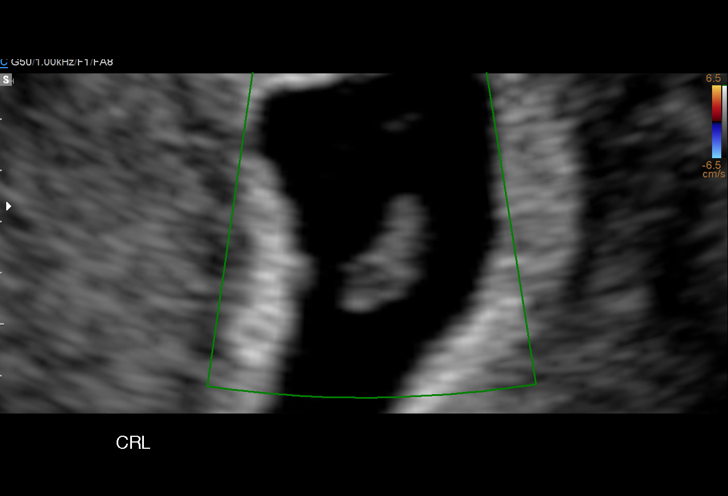
[im 40/52]
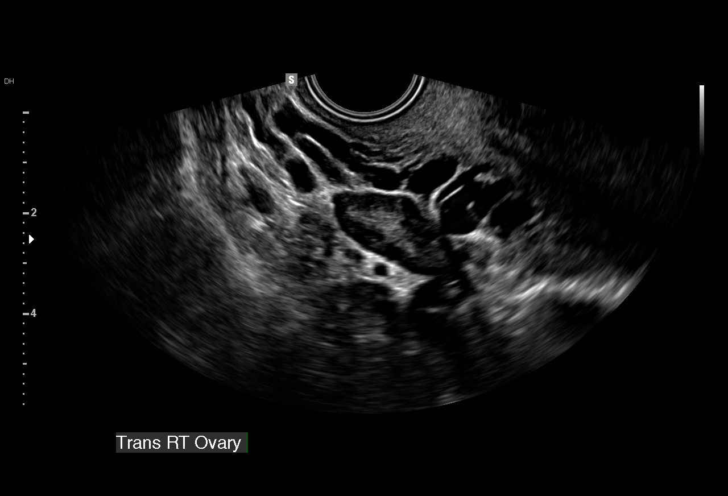
[im 44/52]
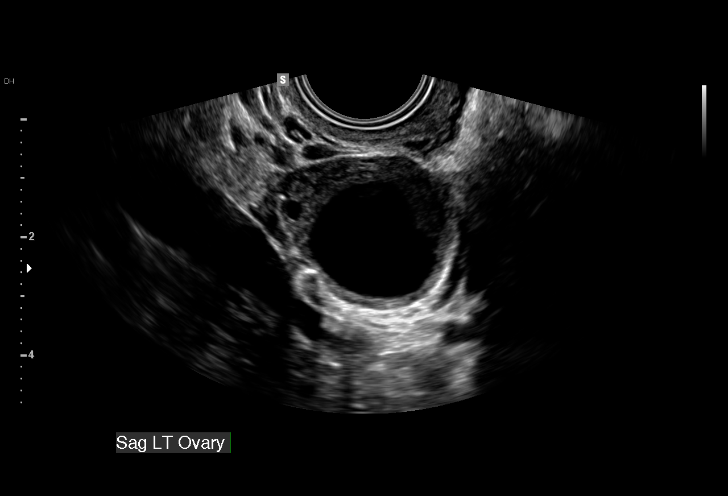
[im 48/52]
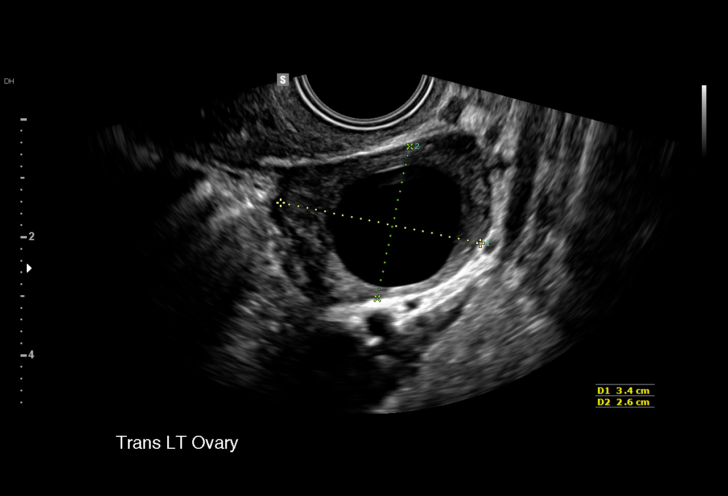
[im 52/52]
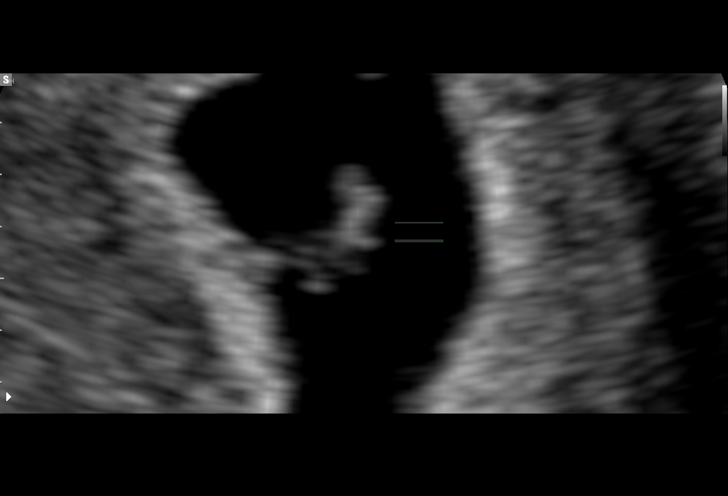

[15 of 28 positions shown; findings below may reference images not displayed]

FINDINGS: Intrauterine gestational sac: Present

Yolk sac:  Present

Embryo:  Present

Cardiac Activity: None

CRL:  4.9  mm   6 w   1 d                  US EDC: 10/13/2016

Subchorionic hemorrhage:  None visualized.

Maternal uterus/adnexae: Gestational sac is slightly enlarged and
irregular in shape. The right ovary measures 3.1 x 2.7 x 1.3 cm with
a normal appearance. Left ovary measures 3.3 x 3.4 x 2.6 cm. There
is an anechoic structure within the left ovary that is suggestive
for a corpus luteum cyst. No free fluid.
IMPRESSION: Intrauterine pregnancy with a gestational age discrepancy between
the LMP and ultrasound. Gestational age by ultrasound is 6 weeks and
1 day and gestational age by LMP is 9 weeks and 5 days. In addition,
gestational sac is slightly irregular and no detectable fetal heart
rate.Findings are suspicious but not yet definitive for failed
pregnancy. Recommend follow-up US in 10-14 days for definitive
diagnosis. This recommendation follows SRU consensus guidelines:
Diagnostic Criteria for Nonviable Pregnancy Early in the First
Trimester. N Engl J Med 9779; [DATE].

## 2017-06-29 ENCOUNTER — Emergency Department (HOSPITAL_COMMUNITY)
Admission: EM | Admit: 2017-06-29 | Discharge: 2017-06-30 | Disposition: A | Discharge status: Home / Self Care | Payer: BLUE CROSS/BLUE SHIELD | Attending: Emergency Medicine | Admitting: Emergency Medicine

## 2017-06-29 ENCOUNTER — Encounter (HOSPITAL_COMMUNITY): Payer: Self-pay | Admitting: *Deleted

## 2017-06-29 DIAGNOSIS — Z87891 Personal history of nicotine dependence: Secondary | ICD-10-CM | POA: Insufficient documentation

## 2017-06-29 DIAGNOSIS — Z3A28 28 weeks gestation of pregnancy: Secondary | ICD-10-CM | POA: Insufficient documentation

## 2017-06-29 DIAGNOSIS — I10 Essential (primary) hypertension: Secondary | ICD-10-CM

## 2017-06-29 DIAGNOSIS — O10913 Unspecified pre-existing hypertension complicating pregnancy, third trimester: Secondary | ICD-10-CM | POA: Insufficient documentation

## 2017-06-29 DIAGNOSIS — R197 Diarrhea, unspecified: Secondary | ICD-10-CM | POA: Insufficient documentation

## 2017-06-29 DIAGNOSIS — E0590 Thyrotoxicosis, unspecified without thyrotoxic crisis or storm: Secondary | ICD-10-CM | POA: Insufficient documentation

## 2017-06-29 LAB — URINALYSIS, ROUTINE W REFLEX MICROSCOPIC
BILIRUBIN URINE: NEGATIVE
Glucose, UA: NEGATIVE mg/dL
KETONES UR: NEGATIVE mg/dL
LEUKOCYTES UA: NEGATIVE
NITRITE: NEGATIVE
PROTEIN: NEGATIVE mg/dL
Specific Gravity, Urine: 1.02 (ref 1.005–1.030)
pH: 5 (ref 5.0–8.0)

## 2017-06-29 LAB — I-STAT BETA HCG BLOOD, ED (MC, WL, AP ONLY)

## 2017-06-29 LAB — BASIC METABOLIC PANEL
ANION GAP: 8 (ref 5–15)
BUN: 9 mg/dL (ref 6–20)
CHLORIDE: 107 mmol/L (ref 101–111)
CO2: 23 mmol/L (ref 22–32)
Calcium: 9.6 mg/dL (ref 8.9–10.3)
Creatinine, Ser: 0.35 mg/dL — ABNORMAL LOW (ref 0.44–1.00)
GFR calc Af Amer: >60 mL/min (ref >60)
Glucose, Bld: 119 mg/dL — ABNORMAL HIGH (ref 65–99)
POTASSIUM: 3.5 mmol/L (ref 3.5–5.1)
SODIUM: 138 mmol/L (ref 135–145)

## 2017-06-29 LAB — CBC
HEMATOCRIT: 39.2 % (ref 36.0–46.0)
Hemoglobin: 13.1 g/dL (ref 12.0–15.0)
MCH: 25.8 pg — ABNORMAL LOW (ref 26.0–34.0)
MCHC: 33.4 g/dL (ref 30.0–36.0)
MCV: 77.3 fL — AB (ref 78.0–100.0)
Platelets: 210 10*3/uL (ref 150–400)
RBC: 5.07 MIL/uL (ref 3.87–5.11)
RDW: 14.8 % (ref 11.5–15.5)
WBC: 7.3 10*3/uL (ref 4.0–10.5)

## 2017-06-29 NOTE — ED Notes (Signed)
Writer called for reassessment of vital signs, no response.

## 2017-06-29 NOTE — ED Triage Notes (Signed)
Pt reports hx of HTN but not taking meds at this time. Pt having headache since yesterday with dizziness and feeling lightheaded. Reports weight loss and fatigue over past several months.

## 2017-06-30 LAB — HEPATIC FUNCTION PANEL
ALBUMIN: 3.4 g/dL — AB (ref 3.5–5.0)
ALK PHOS: 139 U/L — AB (ref 38–126)
ALT: 13 U/L — ABNORMAL LOW (ref 14–54)
AST: 21 U/L (ref 15–41)
Bilirubin, Direct: 0.1 mg/dL (ref 0.1–0.5)
Indirect Bilirubin: 0.3 mg/dL (ref 0.3–0.9)
TOTAL PROTEIN: 6.4 g/dL — AB (ref 6.5–8.1)
Total Bilirubin: 0.4 mg/dL (ref 0.3–1.2)

## 2017-06-30 LAB — DIFFERENTIAL
Basophils Absolute: 0 10*3/uL (ref 0.0–0.1)
Basophils Relative: 0 %
EOS PCT: 5 %
Eosinophils Absolute: 0.4 10*3/uL (ref 0.0–0.7)
LYMPHS ABS: 4 10*3/uL (ref 0.7–4.0)
LYMPHS PCT: 50 %
Monocytes Absolute: 1.1 10*3/uL — ABNORMAL HIGH (ref 0.1–1.0)
Monocytes Relative: 14 %
NEUTROS PCT: 31 %
Neutro Abs: 2.4 10*3/uL (ref 1.7–7.7)

## 2017-06-30 LAB — T4, FREE: Free T4: >5.5 ng/dL — ABNORMAL HIGH (ref 0.61–1.12)

## 2017-06-30 LAB — TSH: TSH: <0.01 u[IU]/mL — ABNORMAL LOW (ref 0.350–4.500)

## 2017-06-30 MED ORDER — METHIMAZOLE 5 MG PO TABS: 5.0000 mg | ORAL_TABLET | Freq: Three times a day (TID) | ORAL | 0 refills | Status: DC

## 2017-06-30 MED ORDER — SODIUM CHLORIDE 0.9 % IV BOLUS (SEPSIS): 1000.0000 mL | Freq: Once | INTRAVENOUS | Status: DC

## 2017-06-30 NOTE — ED Notes (Signed)
Pt came up to desk asking for graham crackers & d/c paperwork. Told pt to please step back into room and this RN would bring her paperwork to her. Pt stated "I can stand @ the desk and wait." Ida, NT got pt some graham crackers. This RN attempted to go over pt D/C paperwork. Pt walks over to computer and stands over this RN as this RN attempts to read through D/C paperwork. Pt sts she does not need this RN to read her paperwork to her "like that, what's your name? You got something to say? You are very rude for reading to me like that." Pt snatched paperwork out of this RN's hands and proceeded to walk out of room. Pt showing NAD. RR even and unlabored @ time of D/C.

## 2017-06-30 NOTE — ED Notes (Signed)
Pt reports not being able to afford HTN meds x several months. Pt is 6 months post partum. Pt denies pain at this time but recently has experienced weight loss, intermittent HA, intermittent pain in legs, intermittent lower abd pain (cramping pt is on menstrual cycle) Pt also reports diarrhea this morning.  Pt is A & O, EKG and labs done in triage. NAD

## 2017-06-30 NOTE — Discharge Instructions (Signed)
Your bloodwork showed signs of an overactive thyroid gland. This accounts for your weight loss, diarrhea, sweats. He will need additional tests to determine the cause of your overactive thyroid gland. In the meantime, you're being given medication to try to treat this condition. Also, you can take loperamide (Imodium AD) as needed to control diarrhea.  Your blood pressure is somewhat elevated today. Please continue to monitor it. You may need to have some adjustments to your blood pressure medications.

## 2017-06-30 NOTE — ED Provider Notes (Signed)
[facility]-EMERGENCY DEPT Provider Note   CSN: [id number] Arrival date & time: 06/29/17  1843     History   Chief Complaint Chief Complaint  Patient presents with  . Headache  . Hypertension    HPI Diamond Patel is a 31 y.o. female.  The history is provided by the patient.  She has multiple complaints. She is 7 months postpartum and relates 30 pound involuntary weight loss during that time. She has had some night sweats. For the last 2 months, she has had diarrhea with 7-8 bowel movements a day including nocturnal bowel movements and occasional urge incontinence. She denies nausea or vomiting, but appetite has been decreased. Menses have been normal. She relates that her blood pressure has been elevated and that it was extremely elevated earlier today. She was at a pharmacy where the machine showed her blood pressure was over 200 systolic. He has had intermittent headaches which she describes as just normal headaches which usually respond to ibuprofen or naproxen. Also complaining of some aching in her legs.  Past Medical History:  Diagnosis Date  . Hypertension     Patient Active Problem List   Diagnosis Date Noted  . Preterm delivery 11/18/2016  . Preterm premature rupture of membranes 11/15/2016  . Chronic hypertension affecting pregnancy 11/15/2016  . Supervision of normal pregnancy, antepartum 11/12/2016  . History of preterm PROM in previous pregnancy, currently pregnant 11/12/2016    History reviewed. No pertinent surgical history.  OB History    Gravida Para Term Preterm AB Living   3 2 0 SAB TAB Ectopic Multiple Live Births   1     0 2       Home Medications    Prior to Admission medications   Medication Sig Start Date End Date Taking? Authorizing Provider  amLODipine (NORVASC) 5 MG tablet Take 1 tablet (5 mg total) by mouth daily. 02/17/17   Constant, Peggy, MD  docusate sodium (COLACE) 100 MG capsule Take 1 capsule (100 mg total) by mouth 2 (two) times  daily. Patient not taking: Reported on 02/17/2017 11/18/16   Renne Musca, MD    Family History Family History  Problem Relation Age of Onset  . Hypertension Father   . Hypertension Mother     Social History Social History  Substance Use Topics  . Smoking status: Former Smoker    Packs/day: 0.10    Types: Cigarettes  . Smokeless tobacco: Never Used  . Alcohol use Yes     Comment: occasionally     Allergies   Patient has no known allergies.   Review of Systems Review of Systems  All other systems reviewed and are negative.    Physical Exam Updated Vital Signs BP (!) 146/104   Pulse (!) 107   Temp 98.4 F (36.9 C) (Oral)   Resp 16   SpO2 100%   Physical Exam  Nursing note and vitals reviewed.  31 year old female, resting comfortably and in no acute distress. Vital signs are significant for hypertension and mild tachycardia. Oxygen saturation is 100%, which is normal. Head is normocephalic and atraumatic. PERRLA, EOMI. Oropharynx is clear. Neck is nontender and supple without adenopathy or JVD. Back is nontender and there is no CVA tenderness. Lungs are clear without rales, wheezes, or rhonchi. Chest is nontender. Heart has regular rate and rhythm without murmur. Abdomen is soft, flat, nontender without masses or hepatosplenomegaly and peristalsis is normoactive. Extremities have no cyanosis or edema, full range  of motion is present. Skin is warm and dry without rash. Neurologic: Mental status is normal, cranial nerves are intact, there are no motor or sensory deficits.  ED Treatments / Results  Labs (all labs ordered are listed, but only abnormal results are displayed) Labs Reviewed  BASIC METABOLIC PANEL - Abnormal; Notable for the following:       Result Value   Glucose, Bld 119 (*)    Creatinine, Ser 0.35 (*)    All other components within normal limits  CBC - Abnormal; Notable for the following:    MCV 77.3 (*)    MCH 25.8 (*)    All other  components within normal limits  URINALYSIS, ROUTINE W REFLEX MICROSCOPIC - Abnormal; Notable for the following:    Hgb urine dipstick LARGE (*)    Bacteria, UA RARE (*)    Squamous Epithelial / LPF 0-5 (*)    All other components within normal limits  TSH - Abnormal; Notable for the following:    TSH <0.010 (*)    All other components within normal limits  HEPATIC FUNCTION PANEL - Abnormal; Notable for the following:    Total Protein 6.4 (*)    Albumin 3.4 (*)    ALT 13 (*)    Alkaline Phosphatase 139 (*)    All other components within normal limits  DIFFERENTIAL - Abnormal; Notable for the following:    Monocytes Absolute 1.1 (*)    All other components within normal limits  T4, FREE  I-STAT BETA HCG BLOOD, ED ([facility], [facility], [facility] ONLY)    EKG  EKG Interpretation  Date/Time:  Sunday June 29 2017 18:48:25 EDT Ventricular Rate:  119 PR Interval:  122 QRS Duration: 80 QT Interval:  356 QTC Calculation: 500 R Axis:   100 Text Interpretation:  Sinus tachycardia Rightward axis T wave abnormality, consider lateral ischemia Abnormal ECG When compared with ECG of 11/27/2015, Nonspecific T wave abnormality is now present Reconfirmed by Dione Booze ([id number]) on 06/30/2017 12:31:11 AM      Procedures Procedures (including critical care time)  Medications Ordered in ED Medications - No data to display   Initial Impression / Assessment and Plan / ED Course  I have reviewed the triage vital signs and the nursing notes.  Pertinent lab results that were available during my care of the patient were reviewed by me and considered in my medical decision making (see chart for details).  Multiple complaints. Her blood pressure is somewhat elevated, but no evidence of any end organ damage. ECG shows some T wave changes, but she has no complaints of chest discomfort or dyspnea and these are not felt to be clinically significant. Initial laboratory workup is unremarkable including normal renal  function and had normal urinalysis. Hepatic function panel has been requested. Weight loss, sweats, diarrhea could certainly be from hyperthyroidism. TSH has been ordered. Patient was offered IV fluids, but declined. Old records are reviewed confirming vaginal delivery on 11/16/2016.  TSH is come back undetectable, consistent with hyperthyroidism. Free T4 has been requested, with results pending. She does not appear to be in thyroid storm. She has minimal tachycardia of. It is felt that she is safe to start on medication as an outpatient. She is given a prescription for methimazole. She is to continue monitoring her blood pressure as an outpatient. She is referred to endocrinology and [facility]. Return precautions discussed.  Final Clinical Impressions(s) / ED Diagnoses   Final diagnoses:  Hyperthyroidism  Essential  hypertension    New Prescriptions New Prescriptions   METHIMAZOLE (TAPAZOLE) 5 MG TABLET    Take 1 tablet (5 mg total) by mouth 3 (three) times daily.     Dione Booze, MD 06/30/17 [phone]

## 2017-10-23 ENCOUNTER — Ambulatory Visit: Payer: Medicaid Other | Admitting: Endocrinology

## 2017-10-23 ENCOUNTER — Encounter: Payer: Self-pay | Admitting: Endocrinology

## 2017-10-23 DIAGNOSIS — E0590 Thyrotoxicosis, unspecified without thyrotoxic crisis or storm: Secondary | ICD-10-CM | POA: Diagnosis not present

## 2017-10-23 LAB — T4, FREE: FREE T4: 3.16 ng/dL — AB (ref 0.60–1.60)

## 2017-10-23 MED ORDER — METOPROLOL SUCCINATE ER 25 MG PO TB24: 25.0000 mg | ORAL_TABLET | Freq: Every day | ORAL | 3 refills | Status: AC

## 2017-10-23 MED ORDER — METOPROLOL SUCCINATE ER 25 MG PO TB24: 25.0000 mg | ORAL_TABLET | Freq: Every day | ORAL | 3 refills | Status: DC

## 2017-10-23 NOTE — Patient Instructions (Addendum)
In view of your medical condition, you should avoid pregnancy until we have decided it is safe. blood tests are requested for you today.  We'll let you know about the results. If ever you have fever while taking methimazole, stop it and call us, even if the reason is obvious, because of the risk of a rare side-effect. Please come back for a follow-up appointment in 1 month. I have sent a prescription to your pharmacy, also for a pill to help your blood pressure and heart racing. When your thyroid is lower, we can do the radioactive iodine pill.  It is easy.

## 2017-10-23 NOTE — Progress Notes (Signed)
Subjective:    Patient ID: Diamond Patel, female    DOB: Feb 08, 1986, 32 y.o.   MRN: [id number]  HPI Pt is referred by Dr Preston Fleeting, for hyperthyroidism.  Pt reports he was dx'ed with hyperthyroidism in 2018, after a pregnancy (delivery 2/18).  She was rx'ed tapazole, which she takes as rx'ed.  she has never had XRT to the anterior neck, or thyroid surgery.  she has never had thyroid imaging.  she does not consume kelp or any other non-prescribed thyroid medication.  she has never been on amiodarone.  She reports moderate heat intolerance, and assoc diaphoresis.  She is not breast-feeding.  Pt says she is at risk for another pregnancy.  Past Medical History:  Diagnosis Date  . Hypertension     History reviewed. No pertinent surgical history.  Social History   Socioeconomic History  . Marital status: Single    Spouse name: Not on file  . Number of children: Not on file  . Years of education: Not on file  . Highest education level: Not on file  Social Needs  . Financial resource strain: Not on file  . Food insecurity - worry: Not on file  . Food insecurity - inability: Not on file  . Transportation needs - medical: Not on file  . Transportation needs - non-medical: Not on file  Occupational History  . Not on file  Tobacco Use  . Smoking status: Former Smoker    Packs/day: 0.10    Types: Cigarettes  . Smokeless tobacco: Never Used  Substance and Sexual Activity  . Alcohol use: Yes    Comment: occasionally  . Drug use: No  . Sexual activity: Yes  Other Topics Concern  . Not on file  Social History Narrative  . Not on file    No current outpatient medications on file prior to visit.   No current facility-administered medications on file prior to visit.     No Known Allergies  Family History  Problem Relation Age of Onset  . Hypertension Father   . Hypertension Mother   . Hyperthyroidism Maternal Aunt     BP (!) 172/78 (BP Location: Left Arm, Patient Position: Sitting,  Cuff Size: Normal)   Pulse (!) 111   Wt 138 lb (62.6 kg)   SpO2 97%   BMI 23.69 kg/m    Review of Systems denies hoarseness, diplopia, polyuria, muscle weakness, edema, itching, easy bruising, and rhinorrhea.  She has weight loss despite good appetite.  She has anxiety, doe, palpitations, tremor, diarrhea, and headache.  She has irreg menses.        Objective:   Physical Exam VS: see vs page GEN: no distress HEAD: head: no deformity eyes: no periorbital swelling, no proptosis external nose and ears are normal mouth: no lesion seen NECK: thyroid is 5 times normal size--diffuse.   CHEST WALL: no deformity LUNGS: clear to auscultation CV: tachycardia rate, but reg rhythm, no murmur.  ABD: abdomen is soft, nontender.  no hepatosplenomegaly.  not distended.  no hernia MUSCULOSKELETAL: muscle bulk and strength are grossly normal.  no obvious joint swelling.  gait is normal and steady.  EXTEMITIES: no deformity.  no edema PULSES: no carotid bruit NEURO:  cn 2-12 grossly intact.   readily moves all 4's.  sensation is intact to touch on all 4's SKIN:  Normal texture and temperature.  No rash or suspicious lesion is visible.   NODES:  None palpable at the neck PSYCH: alert, well-oriented.  Does not appear  anxious nor depressed.   Lab Results  Component Value Date   TSH <0.010 (L) 06/30/2017   I have reviewed outside records, and summarized: Pt was noted to have suppressed TSH, and referred here.  She was seen in ER, and had multiple s/s c/w hyperthyroidism.  I personally reviewed electrocardiogram tracing (06/29/17): Indication: hyperthyroidism Impression: ST.  No MI.  No hypertrophy.  Lateral inverted T-waves Compared to 2017: both abnormalities are new     Assessment & Plan:  Hyperthyroidism, due to Grave's Dz, new to me.    Patient Instructions  In view of your medical condition, you should avoid pregnancy until we have decided it is safe. blood tests are requested for you  today.  We'll let you know about the results. If ever you have fever while taking methimazole, stop it and call us, even if the reason is obvious, because of the risk of a rare side-effect. Please come back for a follow-up appointment in 1 month. I have sent a prescription to your pharmacy, also for a pill to help your blood pressure and heart racing. When your thyroid is lower, we can do the radioactive iodine pill.  It is easy.

## 2017-10-24 LAB — TSH

## 2017-10-25 MED ORDER — METHIMAZOLE 10 MG PO TABS: 40.0000 mg | ORAL_TABLET | Freq: Two times a day (BID) | ORAL | 2 refills | Status: DC

## 2017-10-27 ENCOUNTER — Other Ambulatory Visit: Payer: Self-pay

## 2017-11-24 ENCOUNTER — Ambulatory Visit: Payer: Medicaid Other | Admitting: Endocrinology

## 2018-11-10 ENCOUNTER — Ambulatory Visit: Payer: Medicaid Other | Admitting: Endocrinology

## 2018-11-10 ENCOUNTER — Telehealth: Payer: Self-pay | Admitting: Endocrinology

## 2018-11-10 ENCOUNTER — Encounter: Payer: Self-pay | Admitting: Endocrinology

## 2018-11-10 VITALS — BP 148/84 | HR 84 | Ht 64.0 in | Wt 149.2 lb

## 2018-11-10 DIAGNOSIS — E0590 Thyrotoxicosis, unspecified without thyrotoxic crisis or storm: Secondary | ICD-10-CM | POA: Diagnosis not present

## 2018-11-10 LAB — T4, FREE: Free T4: 0.93 ng/dL (ref 0.60–1.60)

## 2018-11-10 LAB — TSH: TSH: 0.37 u[IU]/mL (ref 0.35–4.50)

## 2018-11-10 MED ORDER — METHIMAZOLE 10 MG PO TABS: 20.0000 mg | ORAL_TABLET | Freq: Two times a day (BID) | ORAL | 2 refills | Status: AC

## 2018-11-10 NOTE — Progress Notes (Signed)
Subjective:    Patient ID: Diamond Patel, female    DOB: 03-10-1986, 33 y.o.   MRN: [id number]  HPI Pt returns for f/u of hyperthyroidism (dx'ed 2018, after a pregnancy; she was rx'ed tapazole; she has never had thyroid imaging, but PE suggests Grave's Dz). pt says she is at risk for another pregnancy; pt states she feels better in general, except for anxiety.  Specifically, she denies tremor.   Past Medical History:  Diagnosis Date  . Hypertension     No past surgical history on file.  Social History   Socioeconomic History  . Marital status: Single    Spouse name: Not on file  . Number of children: Not on file  . Years of education: Not on file  . Highest education level: Not on file  Occupational History  . Not on file  Social Needs  . Financial resource strain: Not on file  . Food insecurity:    Worry: Not on file    Inability: Not on file  . Transportation needs:    Medical: Not on file    Non-medical: Not on file  Tobacco Use  . Smoking status: Former Smoker    Packs/day: 0.10    Types: Cigarettes  . Smokeless tobacco: Never Used  Substance and Sexual Activity  . Alcohol use: Yes    Comment: occasionally  . Drug use: No  . Sexual activity: Yes  Lifestyle  . Physical activity:    Days per week: Not on file    Minutes per session: Not on file  . Stress: Not on file  Relationships  . Social connections:    Talks on phone: Not on file    Gets together: Not on file    Attends religious service: Not on file    Active member of club or organization: Not on file    Attends meetings of clubs or organizations: Not on file    Relationship status: Not on file  . Intimate partner violence:    Fear of current or ex partner: Not on file    Emotionally abused: Not on file    Physically abused: Not on file    Forced sexual activity: Not on file  Other Topics Concern  . Not on file  Social History Narrative  . Not on file    Current Outpatient Medications on File  Prior to Visit  Medication Sig Dispense Refill  . metoprolol succinate (TOPROL-XL) 25 MG 24 hr tablet Take 1 tablet (25 mg total) by mouth daily. 30 tablet 3   No current facility-administered medications on file prior to visit.     No Known Allergies  Family History  Problem Relation Age of Onset  . Hypertension Father   . Hypertension Mother   . Hyperthyroidism Maternal Aunt     BP (!) 148/84 (BP Location: Right Arm, Patient Position: Sitting, Cuff Size: Normal) Comment: did not take antihypertensive  Pulse 84   Ht 5\' 4"  (1.626 m)   Wt 149 lb 3.2 oz (67.7 kg)   SpO2 98%   BMI 25.61 kg/m    Review of Systems Denies fever    Objective:   Physical Exam VITAL SIGNS:  See vs page GENERAL: no distress NECK: thyroid is approx 3 times normal size.  No palpable nodule.    Lab Results  Component Value Date   TSH 0.37 11/10/2018       Assessment & Plan:  HTN: is noted today Hyperthyroidism: well-controlled.   Patient Instructions  Your blood pressure is high today.  Please see a new primary care provider soon, to have it rechecked.   please call [phone] ([facility] physician referral line), to get an appointment with a new primary care provider.   In view of your medical condition, you should avoid pregnancy until we have decided it is safe. Thyroid blood tests are requested for you today.  We'll let you know about the results. If ever you have fever while taking methimazole, stop it and call us, even if the reason is obvious, because of the risk of a rare side-effect.  It is best to never miss the medication.  However, if you do miss it, you can take double the next time.   Please come back for a follow-up appointment in 1 month.  When your thyroid is lower, we can do the radioactive iodine pill.  It is easy.

## 2018-11-10 NOTE — Patient Instructions (Addendum)
Your blood pressure is high today.  Please see a new primary care provider soon, to have it rechecked.   please call [phone] ([facility] physician referral line), to get an appointment with a new primary care provider.   In view of your medical condition, you should avoid pregnancy until we have decided it is safe. Thyroid blood tests are requested for you today.  We'll let you know about the results. If ever you have fever while taking methimazole, stop it and call us, even if the reason is obvious, because of the risk of a rare side-effect.  It is best to never miss the medication.  However, if you do miss it, you can take double the next time.   Please come back for a follow-up appointment in 1 month.  When your thyroid is lower, we can do the radioactive iodine pill.  It is easy.

## 2018-11-10 NOTE — Telephone Encounter (Signed)
Patient is requesting that both of her RX's need to be refilled. [id number] [address], [place]

## 2018-11-10 NOTE — Telephone Encounter (Signed)
Called pt and advised to contact [facility] and ask for Rx to be transferred to pharmacy of choice provided below. Verbalized acceptance and understanding.

## 2018-12-07 ENCOUNTER — Ambulatory Visit: Payer: Medicaid Other | Admitting: Endocrinology

## 2018-12-08 ENCOUNTER — Ambulatory Visit: Payer: Medicaid Other | Admitting: Endocrinology
